# Patient Record
Sex: Female | Born: 1975
Health system: Southern US, Community
[De-identification: ages and names within clinical notes are randomized; demographics above are authoritative.]

## PROBLEM LIST (undated history)

## (undated) DIAGNOSIS — F32A Depression, unspecified: Secondary | ICD-10-CM

## (undated) DIAGNOSIS — N29 Other disorders of kidney and ureter in diseases classified elsewhere: Secondary | ICD-10-CM

## (undated) DIAGNOSIS — F329 Major depressive disorder, single episode, unspecified: Secondary | ICD-10-CM

## (undated) DIAGNOSIS — D649 Anemia, unspecified: Secondary | ICD-10-CM

## (undated) DIAGNOSIS — F419 Anxiety disorder, unspecified: Secondary | ICD-10-CM

## (undated) DIAGNOSIS — E039 Hypothyroidism, unspecified: Secondary | ICD-10-CM

## (undated) DIAGNOSIS — L309 Dermatitis, unspecified: Secondary | ICD-10-CM

## (undated) DIAGNOSIS — R5383 Other fatigue: Secondary | ICD-10-CM

## (undated) HISTORY — DX: Anxiety disorder, unspecified: F41.9

## (undated) HISTORY — DX: Anemia, unspecified: D64.9

## (undated) HISTORY — DX: Depression, unspecified: F32.A

## (undated) HISTORY — DX: Other disorders of kidney and ureter in diseases classified elsewhere: N29

## (undated) HISTORY — DX: Major depressive disorder, single episode, unspecified: F32.9

## (undated) HISTORY — DX: Other fatigue: R53.83

## (undated) HISTORY — DX: Hypothyroidism, unspecified: E03.9

## (undated) HISTORY — DX: Other disorders of calcium metabolism: E83.59

## (undated) HISTORY — DX: Dermatitis, unspecified: L30.9

---

## 2001-04-01 ENCOUNTER — Emergency Department (HOSPITAL_COMMUNITY): Admission: EM | Admit: 2001-04-01 | Discharge: 2001-04-01 | Payer: Self-pay | Admitting: Emergency Medicine

## 2001-04-16 ENCOUNTER — Encounter: Payer: Self-pay | Admitting: Family Medicine

## 2001-04-16 ENCOUNTER — Ambulatory Visit (HOSPITAL_COMMUNITY): Admission: RE | Admit: 2001-04-16 | Discharge: 2001-04-16 | Payer: Self-pay | Admitting: Family Medicine

## 2001-06-03 ENCOUNTER — Encounter: Payer: Self-pay | Admitting: Family Medicine

## 2001-06-03 ENCOUNTER — Ambulatory Visit (HOSPITAL_COMMUNITY): Admission: RE | Admit: 2001-06-03 | Discharge: 2001-06-03 | Payer: Self-pay | Admitting: Family Medicine

## 2002-04-20 ENCOUNTER — Emergency Department (HOSPITAL_COMMUNITY): Admission: EM | Admit: 2002-04-20 | Discharge: 2002-04-20 | Payer: Self-pay | Admitting: Emergency Medicine

## 2003-03-16 ENCOUNTER — Ambulatory Visit (HOSPITAL_COMMUNITY): Admission: RE | Admit: 2003-03-16 | Discharge: 2003-03-16 | Payer: Self-pay | Admitting: Family Medicine

## 2003-03-16 ENCOUNTER — Encounter: Payer: Self-pay | Admitting: Family Medicine

## 2004-11-21 ENCOUNTER — Other Ambulatory Visit: Admission: RE | Admit: 2004-11-21 | Discharge: 2004-11-21 | Payer: Self-pay | Admitting: Obstetrics and Gynecology

## 2004-11-22 ENCOUNTER — Other Ambulatory Visit: Admission: RE | Admit: 2004-11-22 | Discharge: 2004-11-22 | Payer: Self-pay | Admitting: Obstetrics and Gynecology

## 2008-10-20 ENCOUNTER — Emergency Department: Payer: Self-pay | Admitting: Emergency Medicine

## 2008-12-27 ENCOUNTER — Encounter: Payer: Self-pay | Admitting: Family Medicine

## 2009-01-03 ENCOUNTER — Encounter: Payer: Self-pay | Admitting: Family Medicine

## 2009-02-01 ENCOUNTER — Encounter: Payer: Self-pay | Admitting: Family Medicine

## 2009-06-23 ENCOUNTER — Encounter: Payer: Self-pay | Admitting: Family Medicine

## 2009-11-22 ENCOUNTER — Emergency Department (HOSPITAL_COMMUNITY): Admission: EM | Admit: 2009-11-22 | Discharge: 2009-11-22 | Payer: Self-pay | Admitting: Emergency Medicine

## 2009-12-06 HISTORY — PX: CHOLECYSTECTOMY: SHX55

## 2009-12-21 ENCOUNTER — Encounter (INDEPENDENT_AMBULATORY_CARE_PROVIDER_SITE_OTHER): Payer: Self-pay | Admitting: Surgery

## 2009-12-21 ENCOUNTER — Ambulatory Visit (HOSPITAL_COMMUNITY): Admission: EM | Admit: 2009-12-21 | Discharge: 2009-12-22 | Payer: Self-pay | Admitting: Emergency Medicine

## 2010-02-16 ENCOUNTER — Ambulatory Visit: Payer: Self-pay | Admitting: Physician Assistant

## 2010-02-16 DIAGNOSIS — K8020 Calculus of gallbladder without cholecystitis without obstruction: Secondary | ICD-10-CM | POA: Insufficient documentation

## 2010-02-16 DIAGNOSIS — F329 Major depressive disorder, single episode, unspecified: Secondary | ICD-10-CM

## 2010-02-16 DIAGNOSIS — L259 Unspecified contact dermatitis, unspecified cause: Secondary | ICD-10-CM

## 2010-02-16 DIAGNOSIS — F411 Generalized anxiety disorder: Secondary | ICD-10-CM | POA: Insufficient documentation

## 2010-02-16 DIAGNOSIS — Z9089 Acquired absence of other organs: Secondary | ICD-10-CM

## 2010-02-16 DIAGNOSIS — E079 Disorder of thyroid, unspecified: Secondary | ICD-10-CM | POA: Insufficient documentation

## 2010-02-16 DIAGNOSIS — F419 Anxiety disorder, unspecified: Secondary | ICD-10-CM | POA: Insufficient documentation

## 2010-02-16 DIAGNOSIS — E039 Hypothyroidism, unspecified: Secondary | ICD-10-CM | POA: Insufficient documentation

## 2010-02-16 DIAGNOSIS — F32A Depression, unspecified: Secondary | ICD-10-CM | POA: Insufficient documentation

## 2010-02-17 ENCOUNTER — Encounter: Payer: Self-pay | Admitting: Physician Assistant

## 2010-02-20 LAB — CONVERTED CEMR LAB
ALT: 23 units/L (ref 0–35)
Albumin: 4.7 g/dL (ref 3.5–5.2)
Alkaline Phosphatase: 63 units/L (ref 39–117)
Basophils Absolute: 0.1 10*3/uL (ref 0.0–0.1)
CO2: 23 meq/L (ref 19–32)
Calcium: 9.6 mg/dL (ref 8.4–10.5)
Creatinine, Ser: 0.95 mg/dL (ref 0.40–1.20)
Eosinophils Absolute: 0.3 10*3/uL (ref 0.0–0.7)
Glucose, Bld: 94 mg/dL (ref 70–99)
HCT: 42.8 % (ref 36.0–46.0)
Helicobacter Pylori Antibody-IgG: <0.4
Lymphs Abs: 3.3 10*3/uL (ref 0.7–4.0)
Monocytes Absolute: 1.1 10*3/uL — ABNORMAL HIGH (ref 0.1–1.0)
Monocytes Relative: 13 % — ABNORMAL HIGH (ref 3–12)
Neutro Abs: 3.7 10*3/uL (ref 1.7–7.7)
Neutrophils Relative %: 44 % (ref 43–77)
Sodium: 140 meq/L (ref 135–145)
TSH: 2.129 microintl units/mL (ref 0.350–4.500)
Total Protein: 7.2 g/dL (ref 6.0–8.3)

## 2010-02-21 ENCOUNTER — Encounter (INDEPENDENT_AMBULATORY_CARE_PROVIDER_SITE_OTHER): Payer: Self-pay | Admitting: *Deleted

## 2010-02-21 ENCOUNTER — Encounter: Payer: Self-pay | Admitting: Physician Assistant

## 2010-02-22 ENCOUNTER — Encounter: Payer: Self-pay | Admitting: Physician Assistant

## 2010-02-27 ENCOUNTER — Telehealth: Payer: Self-pay | Admitting: Physician Assistant

## 2010-03-02 ENCOUNTER — Encounter: Payer: Self-pay | Admitting: Physician Assistant

## 2010-03-08 ENCOUNTER — Ambulatory Visit (HOSPITAL_COMMUNITY): Admission: RE | Admit: 2010-03-08 | Discharge: 2010-03-08 | Payer: Self-pay | Admitting: Internal Medicine

## 2010-03-09 ENCOUNTER — Telehealth: Payer: Self-pay | Admitting: Physician Assistant

## 2010-03-09 ENCOUNTER — Emergency Department (HOSPITAL_COMMUNITY)
Admission: EM | Admit: 2010-03-09 | Discharge: 2010-03-09 | Payer: Self-pay | Source: Home / Self Care | Admitting: Family Medicine

## 2010-03-14 ENCOUNTER — Encounter: Payer: Self-pay | Admitting: Physician Assistant

## 2010-03-20 ENCOUNTER — Ambulatory Visit: Payer: Self-pay | Admitting: Physician Assistant

## 2010-03-21 ENCOUNTER — Encounter: Payer: Self-pay | Admitting: Physician Assistant

## 2010-03-22 ENCOUNTER — Encounter: Payer: Self-pay | Admitting: Physician Assistant

## 2010-03-27 ENCOUNTER — Encounter: Payer: Self-pay | Admitting: Physician Assistant

## 2010-03-28 ENCOUNTER — Ambulatory Visit: Payer: Self-pay | Admitting: Physician Assistant

## 2010-03-28 ENCOUNTER — Telehealth: Payer: Self-pay | Admitting: Physician Assistant

## 2010-03-28 LAB — CONVERTED CEMR LAB
BUN: 19 mg/dL (ref 6–23)
CO2: 21 meq/L (ref 19–32)
CO2: 23 meq/L (ref 19–32)
Calcium: 8.9 mg/dL (ref 8.4–10.5)
Creatinine 24 HR UR: 1819 mg/24hr — ABNORMAL HIGH (ref 700–1800)
Creatinine Clearance: 106 mL/min (ref 75–115)
Creatinine, Urine: 148.6 mg/dL
Glucose, Bld: 132 mg/dL — ABNORMAL HIGH (ref 70–99)
Lipase: 29 units/L (ref 0–75)
Phosphorus, Ur: 62.5 mg/dL
Phosphorus, Ur: 91 mg/dL
Phosphorus: 2.6 mg/dL (ref 2.3–4.6)
Phosphorus: 2.6 mg/dL (ref 2.3–4.6)
Potassium: 4.1 meq/L (ref 3.5–5.3)
Potassium: 4.1 meq/L (ref 3.5–5.3)
Sodium: 141 meq/L (ref 135–145)

## 2010-03-30 ENCOUNTER — Encounter: Payer: Self-pay | Admitting: Physician Assistant

## 2010-03-30 ENCOUNTER — Ambulatory Visit (HOSPITAL_COMMUNITY): Admission: RE | Admit: 2010-03-30 | Discharge: 2010-03-30 | Payer: Self-pay | Admitting: Internal Medicine

## 2010-03-30 ENCOUNTER — Encounter (INDEPENDENT_AMBULATORY_CARE_PROVIDER_SITE_OTHER): Payer: Self-pay | Admitting: *Deleted

## 2010-04-14 ENCOUNTER — Telehealth: Payer: Self-pay | Admitting: Physician Assistant

## 2010-04-20 ENCOUNTER — Telehealth: Payer: Self-pay | Admitting: Physician Assistant

## 2010-04-20 ENCOUNTER — Ambulatory Visit: Payer: Self-pay | Admitting: Physician Assistant

## 2010-04-20 DIAGNOSIS — M65839 Other synovitis and tenosynovitis, unspecified forearm: Secondary | ICD-10-CM

## 2010-04-20 DIAGNOSIS — B009 Herpesviral infection, unspecified: Secondary | ICD-10-CM | POA: Insufficient documentation

## 2010-04-20 DIAGNOSIS — M65849 Other synovitis and tenosynovitis, unspecified hand: Secondary | ICD-10-CM

## 2010-04-21 ENCOUNTER — Other Ambulatory Visit: Admission: RE | Admit: 2010-04-21 | Discharge: 2010-04-21 | Payer: Self-pay | Admitting: Internal Medicine

## 2010-04-24 ENCOUNTER — Telehealth: Payer: Self-pay | Admitting: Physician Assistant

## 2010-04-26 ENCOUNTER — Encounter: Payer: Self-pay | Admitting: Physician Assistant

## 2010-05-05 ENCOUNTER — Ambulatory Visit: Payer: Self-pay | Admitting: Gastroenterology

## 2010-05-05 ENCOUNTER — Encounter (INDEPENDENT_AMBULATORY_CARE_PROVIDER_SITE_OTHER): Payer: Self-pay | Admitting: *Deleted

## 2010-05-17 ENCOUNTER — Encounter: Payer: Self-pay | Admitting: Physician Assistant

## 2010-05-18 ENCOUNTER — Emergency Department (HOSPITAL_BASED_OUTPATIENT_CLINIC_OR_DEPARTMENT_OTHER): Admission: EM | Admit: 2010-05-18 | Discharge: 2010-05-18 | Payer: Self-pay | Admitting: Emergency Medicine

## 2010-05-25 ENCOUNTER — Ambulatory Visit (HOSPITAL_COMMUNITY): Admission: RE | Admit: 2010-05-25 | Discharge: 2010-05-25 | Payer: Self-pay | Admitting: Gastroenterology

## 2010-05-25 ENCOUNTER — Ambulatory Visit: Payer: Self-pay | Admitting: Gastroenterology

## 2010-05-25 HISTORY — PX: ESOPHAGOGASTRODUODENOSCOPY: SHX1529

## 2010-06-21 ENCOUNTER — Ambulatory Visit: Payer: Self-pay | Admitting: Obstetrics and Gynecology

## 2010-06-22 ENCOUNTER — Telehealth: Payer: Self-pay | Admitting: Gastroenterology

## 2010-06-27 ENCOUNTER — Telehealth: Payer: Self-pay | Admitting: Gastroenterology

## 2010-07-01 ENCOUNTER — Encounter: Payer: Self-pay | Admitting: Physician Assistant

## 2010-07-06 ENCOUNTER — Ambulatory Visit: Payer: Self-pay | Admitting: Physician Assistant

## 2010-07-06 DIAGNOSIS — R5381 Other malaise: Secondary | ICD-10-CM

## 2010-07-06 DIAGNOSIS — R5383 Other fatigue: Secondary | ICD-10-CM

## 2010-07-07 ENCOUNTER — Telehealth: Payer: Self-pay | Admitting: Physician Assistant

## 2010-07-07 ENCOUNTER — Encounter: Payer: Self-pay | Admitting: Physician Assistant

## 2010-07-07 LAB — CONVERTED CEMR LAB
Albumin: 4.6 g/dL (ref 3.5–5.2)
Creatinine, Ser: 0.87 mg/dL (ref 0.40–1.20)
Eosinophils Relative: 2 % (ref 0–5)
Free T4: 1.39 ng/dL (ref 0.80–1.80)
HCT: 41.7 % (ref 36.0–46.0)
Hemoglobin: 13.8 g/dL (ref 12.0–15.0)
Lymphs Abs: 2.8 10*3/uL (ref 0.7–4.0)
MCHC: 33.1 g/dL (ref 30.0–36.0)
Monocytes Absolute: 0.8 10*3/uL (ref 0.1–1.0)
Neutro Abs: 6.9 10*3/uL (ref 1.7–7.7)
Neutrophils Relative %: 64 % (ref 43–77)
Potassium: 4.5 meq/L (ref 3.5–5.3)
RDW: 14 % (ref 11.5–15.5)
T3, Free: 2.7 pg/mL (ref 2.3–4.2)
TSH: 2.91 microintl units/mL (ref 0.350–4.500)
Total Bilirubin: 0.3 mg/dL (ref 0.3–1.2)
Total Protein: 7.4 g/dL (ref 6.0–8.3)

## 2010-07-11 ENCOUNTER — Encounter (INDEPENDENT_AMBULATORY_CARE_PROVIDER_SITE_OTHER): Payer: Self-pay | Admitting: *Deleted

## 2010-07-11 LAB — CONVERTED CEMR LAB
Barbiturate Quant, Ur: NEGATIVE
Benzodiazepines.: NEGATIVE
Casts: NONE SEEN /lpf
Leukocytes, UA: NEGATIVE
Marijuana Metabolite: NEGATIVE
Methadone: NEGATIVE
Opiate Screen, Urine: NEGATIVE
Phencyclidine (PCP): NEGATIVE
WBC, UA: NONE SEEN cells/hpf (ref <3)

## 2010-07-13 ENCOUNTER — Telehealth: Payer: Self-pay | Admitting: Physician Assistant

## 2010-07-20 ENCOUNTER — Encounter: Payer: Self-pay | Admitting: Physician Assistant

## 2010-08-24 ENCOUNTER — Telehealth (INDEPENDENT_AMBULATORY_CARE_PROVIDER_SITE_OTHER): Payer: Self-pay | Admitting: Nurse Practitioner

## 2010-11-01 ENCOUNTER — Inpatient Hospital Stay (HOSPITAL_COMMUNITY)
Admission: AD | Admit: 2010-11-01 | Discharge: 2010-11-01 | Payer: Self-pay | Source: Home / Self Care | Attending: Obstetrics and Gynecology | Admitting: Obstetrics and Gynecology

## 2010-12-03 LAB — CONVERTED CEMR LAB
Blood in Urine, dipstick: NEGATIVE
Calcium, Total (PTH): 8.8 mg/dL (ref 8.4–10.5)
Ketones, urine, test strip: NEGATIVE
Nitrite: NEGATIVE
Protein, U semiquant: NEGATIVE
Specific Gravity, Urine: 1.02
Urobilinogen, UA: 0.2
WBC Urine, dipstick: NEGATIVE

## 2010-12-05 NOTE — Letter (Signed)
Summary: *HSN Results Follow up  [facility]  [address] [place], [place] [address]   Phone: [phone]  Fax: [phone]      02/21/2010   Zoe Aslinger [address] [place], [place]  [address]   Dear  Ms. Lalanya Hillier,                            ____S.Drinkard,FNP   ____D. Gore,FNP       ____B. McPherson,MD   ____V. Rankins,MD    ____E. Mulberry,MD    ____N. Daphine Deutscher, FNP  ____D. Reche Dixon, MD    ____K. Philipp Deputy, MD    ____Other     This letter is to inform you that your recent test(s):  _______Pap Smear    ___X____Lab Test     _______X-ray    ___X____ is within acceptable limits  _______ requires a medication change  _______ requires a follow-up lab visit  _______ requires a follow-up visit with your provider   Comments:       _________________________________________________________ If you have any questions, please contact our office                     Sincerely,  [place] Shannon [facility]

## 2010-12-05 NOTE — Letter (Signed)
Summary: MED/SOLUTIONS//APPROVED  MED/SOLUTIONS//APPROVED   Imported By: Arta Bruce 05/17/2010 12:43:01  _____________________________________________________________________  External Attachment:    Type:   Image     Comment:   External Document

## 2010-12-05 NOTE — Assessment & Plan Note (Signed)
Summary: OFFICE VISIT//CNS   Vital Signs:  Patient profile:   35 year old female Height:      63 inches Weight:      147 pounds BMI:     26.13 Temp:     98.1 degrees F oral Pulse rate:   92 / minute Pulse rhythm:   regular BP sitting:   116 / 81  (left arm) Cuff size:   regular  Vitals Entered By: [place] Shannon (April 20, 2010 2:08 PM)  Primary Care Provider:  Tereso Newcomer, PA-C   History of Present Illness: Here for CPP. Health Maint: Last Pap 10/2008 No abnormal paps. No abnormal bleeding.  No vaginal d/c or itching or burning. Never had mammo. Not taking calcium.  PHQ9=3 today. Patient restarted OCP she had at home.  Wants IUD.  Abdominal Pain:  No change.  Does not have appt with GI until next month.    Nephrocalcinosis.  Calcium level high on 1/2 24 hour urines.  Spoke with Dr. Hyman Hopes of nephrology today.  PTH and CXR to be checked and patient to be referred to nephrology.  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  Uses alcohol to dry them up.  Wrist pain:  Left wrist.  Works in [employer].  Notes dorsal aspect on medial side.   Worse with movement.  Better with NSAIDs.   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 reports h/o Graves Disease; took meds and then became hypothyroid Anxiety   a. on alprazolam for many years (Dr. Audria Nine  started) ? Anemia Depression   a.  had difficulty with antidepressants in the past (Effexor, Lexapro) with SE's; states does not want to take again Eczema  Past Surgical History: Last updated: 02/16/2010 Cholecystectomy (12/2009) Caesarean section  Family History: Reviewed history from 02/16/2010 and no changes required. Family History Diabetes 1st degree relative - Dad Skin CA - Grandfather Leukemia - Grandmother No colon, breast, ovarian cancer. Heart Disease - grandparents  Social History: Reviewed history from 02/16/2010 and no changes required. 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    Physical Exam  General:  alert, well-developed, and well-nourished.   Head:  normocephalic and atraumatic.   Eyes:  pupils equal, pupils round, pupils reactive to light, and no optic disk abnormalities.   Ears:  R ear normal and L ear normal.   Nose:  no external deformity.   Mouth:  pharynx pink and moist, no erythema, and no exudates.   Neck:  supple.   Breasts:  skin/areolae normal, no masses, no abnormal thickening, no nipple discharge, no tenderness, and no adenopathy.   Lungs:  normal breath sounds.   Heart:  normal rate and regular rhythm.   Abdomen:  soft,  non-tender, and normal bowel sounds.   Rectal:  no external abnormalities.   Genitalia:  normal introitus, no external lesions, no vaginal discharge, mucosa pink and moist, no vaginal or cervical lesions, no vaginal atrophy, no friaility or hemorrhage, normal uterus size and position, and no adnexal masses or tenderness.   Msk:  left wrist: no deformity no snuff box tenderness no pain with palp Extremities:  no edema Neurologic:  alert & oriented X3 and cranial nerves II-XII intact.   Psych:  normally  interactive.     Impression & Recommendations:  Problem # 1:  HERPES LABIALIS (ICD-054.9) will give Rx for Valtrex to use as needed  Problem # 2:  TENDINITIS, LEFT WRIST (ICD-727.05) RICE f/u as needed  Problem # 3:  NEPHROCALCINOSIS (ICD-275.49) refer to Nephrology  Orders: T-Urinalysis ([address]) T-Parathyroid Hormone, Intact ([address]11-91478) CXR- 2view (CXR) Nephrology Referral (Nephro)  Problem # 4:  EPIGASTRIC PAIN (ICD-789.06) GI appt pending  Problem # 5:  Preventive Health Care (ICD-V70.0)  Orders: T-HIV Antibody  (Reflex) ([address]-13086) T-Syphilis Test (RPR) [phone]) T-Pap Smear, Thin Prep (284[address]) T- GC Chlamydia ([address]) KOH/ WET Mount [phone]) T-Urinalysis ([address]-03474)  Problem # 6:  CONTRACEPTIVE MANAGEMENT (ICD-V25.09)  wants the IUD refer to GYN  Orders: Gynecologic Referral (Gyn)  Complete Medication List: 1)  Alprazolam 0.5 Mg Tabs (Alprazolam) .Marland Kitchen.. 1 by mouth every day as needed for anxiety 2)  Synthroid 75 Mcg Tabs (Levothyroxine sodium) .Marland Kitchen.. 1 by mouth every morning on a empty stomach 3)  Clobetasol Propionate 0.05 % Crea (Clobetasol propionate) .... Apply two times a day to eczema as needed 4)  Pepcid 20 Mg Tabs (Famotidine) .... Take 1 tablet by mouth two times a day 5)  Valtrex 500 Mg Tabs (Valacyclovir hcl) .... 4 tabs by mouth two times a day for one day at the sign of outbreak of cold sore  Patient Instructions: 1)  Take 400-600 mg of Ibuprofen (Advil, Motrin) with food every 4-6 hours as needed  for relief of pain or comfort of fever.  2)  Ice your wrist two times a day for a week. 3)  Try to use your hand less for 7-10 days so that it will heal. 4)  Use the Valtrex when you start to feel like a cold sore is to break out. 5)  Schedule fasting lipids (V70.0). 6)  Return to lab in 6 months for TSH (244.9). 7)  Someone will call you about an appointment with the kidney doctor. 8)  Call if you do not get into the  gastroenterologist. 9)  Tobacco is very bad for your health and your loved ones ! You should stop smoking !  10)  Stop smoking tips: Choose a quit date. Cut down before the quit date. Decide what you will do as a substitute when you feel the urge to smoke(gum, toothpick, exercise).  11)  Please schedule a follow-up appointment in 1 year with Amnah Breuer for CPP or sooner if needed.  Prescriptions: VALTREX 500 MG TABS (VALACYCLOVIR HCL) 4 tabs by mouth two times a day for one day at the sign of outbreak of cold sore  #30 x 1   Entered and Authorized by:   Tereso Newcomer PA-C   Signed by:   Tereso Newcomer PA-C on 04/20/2010   Method used:   Print then Give to Patient   RxID:   [id number]   Laboratory Results   Urine Tests    Routine Urinalysis   Glucose: negative   (Normal Range: Negative) Bilirubin:  negative   (Normal Range: Negative) Ketone: negative   (Normal Range: Negative) Spec. Gravity: 1.020   (Normal Range: 1.003-1.035) Blood: negative   (Normal Range: Negative) pH: 6.0   (Normal Range: 5.0-8.0) Protein: negative   (Normal Range: Negative) Urobilinogen: 0.2   (Normal Range: 0-1) Nitrite: negative   (Normal Range: Negative) Leukocyte Esterace: negative   (Normal Range: Negative)      Wet Mount Source: vaginal WBC/hpf: 1-5 Bacteria/hpf: rare Clue cells/hpf: none  Negative whiff Yeast/hpf: none Wet Mount KOH: Negative Trichomonas/hpf: none

## 2010-12-05 NOTE — Letter (Signed)
Summary: REQUESTING RECORDS FROM [facility]  REQUESTING RECORDS FROM [facility]   Imported By: Arta Bruce 03/27/2010 12:03:28  _____________________________________________________________________  External Attachment:    Type:   Image     Comment:   External Document

## 2010-12-05 NOTE — Progress Notes (Signed)
Summary: Home health referral  Phone Note Call from Patient   Summary of Call: [facility] called to say that did not get urine Initial call taken by: Michelle Nasuti,  July 07, 2010 11:40 AM  Follow-up for Phone Call        left message for pt to return call Follow-up by: Michelle Nasuti,  July 07, 2010 11:42 AM  Additional Follow-up for Phone Call Additional follow up Details #1::        repeat Additional Follow-up by: Tereso Newcomer PA-C,  July 07, 2010 11:50 AM    Additional Follow-up for Phone Call Additional follow up Details #2::    PT IS AWARE WILL COMEIN AT 4... [place] SHANNON  All labs were normal. I want to set her up for an overnight oximetry test to screen for sleep apnea. Referral is in basket. Please send to Ar[facility] Medici after patient has been notified. Tereso Newcomer PA-C  July 07, 2010 12:47 PM   Additional Follow-up for Phone Call Additional follow up Details #3:: Details for Additional Follow-up Action Taken: pt is aware via brenda.... [place] Shannon  July 11, 2010 3:20 PM      Complete Medication List: 1)  Alprazolam 0.5 Mg Tabs (Alprazolam) .Marland Kitchen.. 1 by mouth every day as needed for anxiety 2)  Synthroid 75 Mcg Tabs (Levothyroxine sodium) .Marland Kitchen.. 1 by mouth every morning on a empty stomach 3)  Clobetasol Propionate 0.05 % Crea (Clobetasol propionate) .... Apply two times a day to eczema as needed 4)  Valtrex 500 Mg Tabs (Valacyclovir hcl) .... 4 tabs by mouth two times a day for one day at the sign of outbreak of cold sore 5)  Ibuprofen 200 Mg Tabs (Ibuprofen) .... Take one by mouth once daily as needed 6)  Cholestyramine 4 Gm/dose Powd (Cholestyramine) .... 4 gm by mouth q am  do not take within 2 hours of other meds  Other Orders: Home Health Referral [facility])

## 2010-12-05 NOTE — Procedures (Signed)
Summary: Endoscopic Ultrasound  Patient: Katie Little Note: All result statuses are Final unless otherwise noted.  Tests: (1) Endoscopic Ultrasound (EUS)  EUS Endoscopic Ultrasound                             DONE     [facility]     [address] [place], [place]  [address]           ENDOSCOPIC ULTRASOUND PROCEDURE REPORT     PATIENT:  Katie, Little  MR#:  [id number]     BIRTHDATE:  December 10, 1975  GENDER:  female     ENDOSCOPIST:  Rachael Fee, MD     REFERRING:  Tereso Newcomer, MD     PROCEDURE DATE:  05/25/2010     PROCEDURE:  Upper EUS     ASA CLASS:  Class II     INDICATIONS:  intermittent abd pains, similar to pains that     occured before GB removed earlier this year (normal IOC), normal     LFTs, CBC, cmet.     MEDICATIONS:   Fentanyl 100 mcg IV, Versed 8.5 mg IV     DESCRIPTION OF PROCEDURE:   After the risks benefits and     alternatives of the procedure were  explained, informed consent     was obtained. The patient was then placed in the left, lateral,     decubitus postion and IV sedation was administered. Throughout the     procedure, the patient's blood pressure, pulse and oxygen     saturations were monitored continuously.  Under direct     visualization, the  endoscope was introduced through the mouth and     advanced to the duodenum.  Water was used as necessary to provide     an acoustic interface.  Upon completion of the imaging, water was     removed and the patient was sent to the recovery room in     satisfactory condition.     <<PROCEDUREIMAGES>>           Endoscopic findings:     1. Normal esophagus     2. Mild gastritis, biopsied to check for H. pylori     3. Normal duodenum, biopsied to check for celiac sprue changes           EUS findings:     1. Normal CBD; non-dilated and without filling defects.     2. Gallbladder surgically absent.     3. Normal pancreatic parenchyma in head, neck, body and tail.     4. Normal main pancreatic  duct.     5. Limited views of liver, spleen, portal and splenic veins were     all normal.           Impression:     There are no retained biliary stones.  Mild gastritis was biopsied     to check for H. pylori and duodenum biopsied to check for celiac     sprue.  Await final biopsies, in the meantime since PPI did not     help her symptoms will try carafate twice daily.           ______________________________     Rachael Fee, MD           n.     eSIGNED:   Rachael Fee at 05/25/2010 08:58 AM  Mckayla, [place], [id number]  Note: An exclamation mark (!) indicates a result that was not dispersed into the flowsheet. Document Creation Date: 05/25/2010 8:59 AM _______________________________________________________________________  (1) Order result status: Final Collection or observation date-time: 05/25/2010 08:49 Requested date-time:  Receipt date-time:  Reported date-time:  Referring Physician:   Ordering Physician: Rob Bunting [phone]) Specimen Source:  Source: Launa Grill Order Number: [id number] Lab site:

## 2010-12-05 NOTE — Letter (Signed)
Summary: PRIOR PCP RECORDS  PRIOR PCP RECORDS   Imported By: Arta Bruce 03/28/2010 10:33:09  _____________________________________________________________________  External Attachment:    Type:   Image     Comment:   External Document

## 2010-12-05 NOTE — Letter (Signed)
Summary: MED SOLUTIONS  MED SOLUTIONS   Imported By: Arta Bruce 03/06/2010 10:34:16  _____________________________________________________________________  External Attachment:    Type:   Image     Comment:   External Document

## 2010-12-05 NOTE — Assessment & Plan Note (Signed)
Summary: without energy//gk   Vital Signs:  Patient profile:   35 year old female Height:      63 inches Weight:      144 pounds BMI:     25.60 Temp:     99.2 degrees F oral Pulse rate:   88 / minute Pulse rhythm:   regular Resp:     18 per minute BP sitting:   120 / 82  (left arm) Cuff size:   regular  Vitals Entered By: [place] Shannon (July 06, 2010 11:55 AM) CC: pt is here cause she doesn't have any energy...Marland KitchenMarland Kitchen pt says she has been feeling like this for a while and other providers told her it was her thyroid....  Is Patient Diabetic? No Pain Assessment Patient in pain? no       Does patient need assistance? Functional Status Self care Ambulation Normal   Primary Care Provider:  Tereso Newcomer, PA-C  CC:  pt is here cause she doesn't have any energy...Marland KitchenMarland Kitchen pt says she has been feeling like this for a while and other providers told her it was her thyroid.... .  History of Present Illness: 35 year old female presents with complaints of fatigue.  She has had problems with fatigue for years now.  It has always been contributed to her thyroid.  She falls asleep during the day.  She wakes up feeling sleepy.  She denies any a.m. headaches.  She does not have any trouble with blood pressure.  She denies any witnessed apneic episodes or severe snoring.  She denies fevers, chills, shortness of breath, chest pain, diarrhea, constipation, melena, hematochezia, significant joint pain, skin changes or rashes, hair changes, syncope.  Current Medications (verified): 1)  Alprazolam 0.5 Mg Tabs (Alprazolam) .Marland Kitchen.. 1 By Mouth Every Day As Needed For Anxiety 2)  Synthroid 75 Mcg Tabs (Levothyroxine Sodium) .Marland Kitchen.. 1 By Mouth Every Morning On A Empty Stomach 3)  Clobetasol Propionate 0.05 % Crea (Clobetasol Propionate) .... Apply Two Times A Day To Eczema As Needed 4)  Valtrex 500 Mg Tabs (Valacyclovir Hcl) .... 4 Tabs By Mouth Two Times A Day For One Day At The Sign of Outbreak of Cold Sore 5)   Ibuprofen 200 Mg Tabs (Ibuprofen) .... Take One By Mouth Once Daily As Needed 6)  Cholestyramine 4 Gm/dose Powd (Cholestyramine) .... 4 Gm By Mouth Q Am  Do Not Take Within 2 Hours of Other Meds  Allergies (verified): No Known Drug Allergies  Physical Exam  General:  alert, well-developed, and well-nourished.   Head:  normocephalic and atraumatic.   Neck:  supple.   Lungs:  normal breath sounds.   Heart:  normal rate and regular rhythm.   Abdomen:  soft.   Msk:  normal ROM.   Extremities:  no edema Neurologic:  alert & oriented X3 and cranial nerves II-XII intact.   Skin:  turgor normal and no rashes.   Psych:  normally interactive.    a  Impression & Recommendations:  Problem # 1:  FATIGUE (ICD-780.79) ? etiology has daytime hypersomnolence but no body habitus c/w sleep apnea check labs if unremarkable; get overnight oximetry with [facility] ($33) ph # [phone] increase activity f/u in 2 mos  Orders: T-Comprehensive Metabolic Panel [phone]) T-CBC w/Diff [phone]) T-TSH [phone]) T-Sed Rate (Automated) [phone]) T-Drug Screen-Urine, (single) [phone]) T-Urinalysis ([id number]) T-Cortisol Total [phone]) T-T4, Free ([id number]) T-T3, Free [phone])  Complete Medication List: 1)  Alprazolam 0.5 Mg Tabs (Alprazolam) .Marland Kitchen.. 1 by mouth every day as needed for anxiety  2)  Synthroid 75 Mcg Tabs (Levothyroxine sodium) .Marland Kitchen.. 1 by mouth every morning on a empty stomach 3)  Clobetasol Propionate 0.05 % Crea (Clobetasol propionate) .... Apply two times a day to eczema as needed 4)  Valtrex 500 Mg Tabs (Valacyclovir hcl) .... 4 tabs by mouth two times a day for one day at the sign of outbreak of cold sore 5)  Ibuprofen 200 Mg Tabs (Ibuprofen) .... Take one by mouth once daily as needed 6)  Cholestyramine 4 Gm/dose Powd (Cholestyramine) .... 4 gm by mouth q am  do not take within 2 hours of other meds  Patient Instructions: 1)  Slowly  increase activity. 2)  Walk for 5 minutes every day.  Increase by 1 minute a week until you get to 20 minutes a day. 3)  Schedule follow up with Scott in 2 months.

## 2010-12-05 NOTE — Progress Notes (Signed)
Summary: WANTS LAB RESULTS  Phone Note Call from Patient Call back at Home Phone [phone]   Reason for Call: Lab or Test Results Summary of Call: Katie Little PT. MS Jenifer CALLED, BECAUSE SHE WOULD LIKE TO SPEAK W/SOMEONE ABOUT HER LABS THAT WAS  DONE RECENTLY, AND SHE SAYS THAT HER APPOINTMENT NEXT WEEK ON 06/16 IS SUPOSE TO BE A CPP AND IT IS NOT, IT SAYS ITS AN APPOINTMENT FOR A FU ON STOMACH PAIN, AND SHE IS A LITTLE UPSET, BECAUSE HER INS (MEDICAID) RUNS OUT SOON. Initial call taken by: Leodis Rains,  April 14, 2010 3:14 PM  Follow-up for Phone Call        Left message on answering machine for pt to call back...Marland KitchenMarland KitchenArmenia Shannon  April 17, 2010 12:18 PM   Additional Follow-up for Phone Call Additional follow up Details #1::        pt is aware and rescheduled for cpp Additional Follow-up by: [place] Shannon,  April 17, 2010 5:00 PM

## 2010-12-05 NOTE — Letter (Signed)
Summary: Historic Patient File  Historic Patient File   Imported By: Arta Bruce 04/19/2010 14:33:22  _____________________________________________________________________  External Attachment:    Type:   Image     Comment:   External Document

## 2010-12-05 NOTE — Letter (Signed)
Summary: *HSN Results Follow up  [facility]  [address] [place], [place] [address]   Phone: [phone]  Fax: [phone]      04/26/2010   Katie Little [address] [place], [place]  [address]   Dear  Ms. [name],                            ____S.Drinkard,FNP   ____D. Gore,FNP       ____B. McPherson,MD   ____V. Rankins,MD    ____E. Mulberry,MD    ____N. Daphine Deutscher, FNP  ____D. Reche Dixon, MD    ____K. Philipp Deputy, MD    __x__S. Alben Spittle, PA-C     This letter is to inform you that your recent test(s):  ___x____Pap Smear    _______Lab Test     _______X-ray    ___x____ is within acceptable limits  _______ requires a medication change  _______ requires a follow-up lab visit  _______ requires a follow-up visit with your provider   Comments:       _________________________________________________________ If you have any questions, please contact our office                     Sincerely,  Tereso Newcomer PA-C [facility]

## 2010-12-05 NOTE — Procedures (Signed)
Summary: Prep/[facility]  Prep/[facility]   Imported By: Lester [place] 05/11/2010 11:10:57  _____________________________________________________________________  External Attachment:    Type:   Image     Comment:   External Document

## 2010-12-05 NOTE — Assessment & Plan Note (Signed)
History of Present Illness Visit Type: Initial Consult Primary GI MD: Rob Bunting MD Primary Provider: Tereso Newcomer, PA-C Requesting Provider: Tereso Newcomer, PA-C Chief Complaint: Epigastric pain  History of Present Illness:     very pleasant 35 year old woman who has been having epigastric pains, about 2 times a week, 74min-3 hours.  She can have nausea, no vomiting.  Nothing reliably brings the pain on.  Nothing improves it.  Not positional at all.  she says the pain is similar to her previous biliary colic however not as intense and it does not last as long.  GB removed 12/2009, was "full of stones" and was having signficant pains, eventually presented with acute cholecystitis. Dr. Ovidio Kin remove her gallbladder laparoscopically. He documented a normal intraoperative cholangiogram.  She takes ibuprofen 1-2 times a month at most.  No other NSAIDs.  She has rare pyrosis, was getting more before she intentionally lost 40 pounds.  she had complete metabolic profile, CBC about 2 months ago for this pain and it these tests were all normal. She has tried H2 blocker and this caused worse pains. She has not tried proton pump inhibitor           Current Medications (verified): 1)  Alprazolam 0.5 Mg Tabs (Alprazolam) .Marland Kitchen.. 1 By Mouth Every Day As Needed For Anxiety 2)  Synthroid 75 Mcg Tabs (Levothyroxine Sodium) .Marland Kitchen.. 1 By Mouth Every Morning On A Empty Stomach 3)  Clobetasol Propionate 0.05 % Crea (Clobetasol Propionate) .... Apply Two Times A Day To Eczema As Needed 4)  Valtrex 500 Mg Tabs (Valacyclovir Hcl) .... 4 Tabs By Mouth Two Times A Day For One Day At The Sign of Outbreak of Cold Sore 5)  Ibuprofen 200 Mg Tabs (Ibuprofen) .... Take One By Mouth Once Daily As Needed  Allergies (verified): No Known Drug Allergies  Past History:  Past Medical History: Cholelithiasis s/p 2011 laparoscopic cholecystectomy with normal Intra-Op cholangiogram Hypothyroidism   a. reports h/o  Graves Disease; took meds and then became hypothyroid Anxiety   a. on alprazolam for many years (Dr. Audria Nine started) ? Anemia Depression   a.  had difficulty with antidepressants in the past (Effexor, Lexapro) with SE's; states does not want to take again Eczema  Past Surgical History: Cholecystectomy (12/2009)  Caesarean section  Family History: Family History Diabetes 1st degree relative - Dad Skin CA - Grandfather Leukemia - Grandmother No colon, breast, ovarian cancer. 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  All other review of systems was otherwise negative.   Vital Signs:  Patient profile:   35 year old female Height:      63 inches Weight:      142.0 pounds BMI:     25.25 Pulse rate:   80 / minute Pulse rhythm:   regular BP sitting:   104 / 80  (left arm) Cuff size:   regular  Vitals Entered By: Harlow Mares CMA (AAMA) (May 05, 2010 10:00 AM)  Physical Exam  Additional Exam:  Constitutional: generally well appearing Psychiatric: alert and oriented times 3 Eyes: extraocular movements intact Mouth: oropharynx moist, no lesions Neck: supple, no lymphadenopathy Cardiovascular: heart regular rate and rythm  Lungs: CTA bilaterally Abdomen: soft, non-tender, non-distended, no obvious ascites, no peritoneal signs, normal bowel sounds Extremities: no lower extremity edema bilaterally Skin: no lesions on visible extremities    Impression & Recommendations:  Problem # 1:  Epigastric pain she had a normal Intra-Op cholangiogram and has had normal liver tests since then. These facts argue against retained common duct stone. We will proceed with EGD at her soonest  convenience. She may have peptic ulcer disease, gastritis, perhaps significant GERD. In the meantime she will take proton pump inhibitor one pill once daily.  Patient Instructions: 1)  You will be scheduled to have an upper endoscopy (about 2 weeks from now). 2)  Samples of PPI (nexium) given, take one pill 20-30 min prior to dinner meal. 3)  A copy of this information will be sent to Dr. Alben Spittle. 4)  The medication list was reviewed and reconciled.  All changed / newly prescribed medications were explained.  A complete medication list was provided to the patient / caregiver.  Appended Document: Orders Update/EGD    Clinical Lists Changes  Orders: Added new Test order of ZEGD (ZEGD) - Signed

## 2010-12-05 NOTE — Progress Notes (Signed)
Summary: Birth Control options  Phone Note Call from Patient   Summary of Call: THE PT WANTS TO TALK TO Katie ABOUT BIRTH CONTROL OPTIONS. Adilen Pavelko PA-C Initial call taken by: Manon Hilding,  April 24, 2010 2:16 PM  Follow-up for Phone Call        Left message on answer machine for pt. to return call Gaylyn Cheers RN  April 25, 2010 11:59 AM  Pt. returned call [facility] she waited over 1 1/2 hrs and was told no one was there to do her exam. They suggested she plan for 5 hrs when she reschedules however her work situation will not allow her to take time off. Medicaid runs out in July.  GYN Clinic cost:the Mirena cost app. $900 if paying out of pocket, with orange card it would depend how much they are covered. Medicaid would cover. An application process is required for the Mirena. Will forward on to S. Alben Spittle PA Follow-up by: Gaylyn Cheers RN,  April 25, 2010 12:38 PM  Additional Follow-up for Phone Call Additional follow up Details #1::        I submitted a referral to [name] already for GYN for IUD.  I explained to [name] that her medicaid runs out in July.  I would imagine she will hear something soon from [facility].  OCPs is not a good choice in someone who smokes due to increased risk of blood clots.  She can get Depo here as an alternative option. Additional Follow-up by: Tereso Newcomer PA-C,  April 25, 2010 1:25 PM    Additional Follow-up for Phone Call Additional follow up Details #2::    Left message on answering machine for pt to call back.[name]  April 26, 2010 4:57 PM   pt is aware of options and would like to get iud from gyn.... Katie Little  April 27, 2010 8:38 AM

## 2010-12-05 NOTE — Miscellaneous (Signed)
  Clinical Lists Changes  Observations: Added new observation of PAST MED HX: Cholelithiasis s/p 2011 laparoscopic cholecystectomy with normal Intra-Op cholangiogram Hypothyroidism   a. reports h/o Graves Disease; took meds and then became hypothyroid Anxiety   a. on alprazolam for many years (Dr. Audria Nine started) ? Anemia Depression   a.  had difficulty with antidepressants in the past (Effexor, Lexapro) with SE's; states does not want to take again Eczema EGD and Endo U/S done 7.21.2011   a. normal esoph; mild gastritis; normal CBD; GB absent; normal; no biliary stones; bx to check for H. pylori and celiac sprue (07/01/2010 15:27)        Past History:  Past Medical History: Cholelithiasis s/p 2011 laparoscopic cholecystectomy with normal Intra-Op cholangiogram Hypothyroidism   a. reports h/o Graves Disease; took meds and then became hypothyroid Anxiety   a. on alprazolam for many years (Dr. Audria Nine started) ? Anemia Depression   a.  had difficulty with antidepressants in the past (Effexor, Lexapro) with SE's; states does not want to take again Eczema EGD and Endo U/S done 7.21.2011   a. normal esoph; mild gastritis; normal CBD; GB absent; normal; no biliary stones; bx to check for H. pylori and celiac sprue

## 2010-12-05 NOTE — Letter (Signed)
Summary: *Referral Letter  [facility]  [address] [place], [place] [address]   Phone: [phone]  Fax: [phone]    03/28/2010  Thank you in advance for agreeing to see my patient:  Katie Little [address] [place], [place]  [address] Phone: [phone]  Reason for Referral: 35 yo female with h/o cholecystectomy in 12/2009 with epigastric pain.  She had this same pain before her surgery.  It is in a different spot in the epigastrium than pre-surgery, but it is the same pain.  No relation to meals.  No symptoms of acid reflux.  I performed lab work in April that included a CMET, CBC and H. pylori antibody.  All tests were unremarkable.  An abdominal ultrasound was also unremarkable except for a finding of nephrocalcinosis.  I placed her on an H2 receptor blocker and she felt like this made her feel worse so she stopped it.  Please evaluate.  Current Medical Problems: 1)  NEPHROCALCINOSIS (ICD-275.49) 2)  CHOLECYSTECTOMY, LAPAROSCOPIC, HX OF (ICD-V45.79) 3)  EPIGASTRIC PAIN (ICD-789.06) 4)  ECZEMA (ICD-692.9) 5)  FAMILY HISTORY DIABETES 1ST DEGREE RELATIVE (ICD-V18.0) 6)  DEPRESSION (ICD-311) 7)  ANXIETY (ICD-300.00) 8)  HYPOTHYROIDISM (ICD-244.9) 9)  CHOLELITHIASIS (ICD-574.20)  Current Medications: 1)  ALPRAZOLAM 0.5 MG TABS (ALPRAZOLAM) 1 by mouth every day as needed for anxiety 2)  SYNTHROID 75 MCG TABS (LEVOTHYROXINE SODIUM) 1 by mouth every morning on a empty stomach 3)  CLOBETASOL PROPIONATE 0.05 % CREA (CLOBETASOL PROPIONATE) apply two times a day to eczema as needed 4)  PEPCID 20 MG TABS (FAMOTIDINE) Take 1 tablet by mouth two times a day  Past Medical History: 1)  Cholelithiasis 2)  Hypothyroidism 3)    a. reports h/o Graves Disease; took meds and then became hypothyroid 4)  Anxiety 5)    a. on alprazolam for many years (Dr. Audria Nine started) 6)  ? Anemia 7)  Depression 8)    a.  had difficulty with antidepressants in the past (Effexor,  Lexapro) with SE's; states does not want to take again 9)  Eczema  Thank you again for agreeing to see our patient; please contact us if you have any further questions or need additional information.  Sincerely,  Tereso Newcomer PA-C

## 2010-12-05 NOTE — Miscellaneous (Signed)
Summary: rx  Clinical Lists Changes  Medications: Added new medication of CARAFATE 1 GM/10ML  SUSP (SUCRALFATE) take 10ml two times a day - Signed Rx of CARAFATE 1 GM/10ML  SUSP (SUCRALFATE) take 10ml two times a day;  #1 month x 2;  Signed;  Entered by: Rachael Fee MD;  Authorized by: Rachael Fee MD;  Method used: Print then Give to Patient    Prescriptions: CARAFATE 1 GM/10ML  SUSP (SUCRALFATE) take 10ml two times a day  #1 month x 2   Entered and Authorized by:   Rachael Fee MD   Signed by:   Rachael Fee MD on 05/25/2010   Method used:   Print then Give to Patient   RxID:   [id number]

## 2010-12-05 NOTE — Progress Notes (Signed)
Summary: ULTRASOUND REFERRAL  Phone Note Call from Patient Call back at Home Phone [phone]   Reason for Call: Referral Summary of Call: [facility] pt. Ms Zbikowski is calling and checking on her referral to see when she will be getting her ultrasound. Initial call taken by: Leodis Rains,  February 27, 2010 9:16 AM  Follow-up for Phone Call        Left message on answering machine for pt to call back.Marland KitchenMarland KitchenMarland KitchenArmenia Shannon  February 27, 2010 12:40 PM [place] on May 4 @ 9am..... [place] Shannon  February 27, 2010 3:42 PM   pt is aware Follow-up by: [place] Shannon,  February 27, 2010 3:52 PM

## 2010-12-05 NOTE — Progress Notes (Signed)
Summary: meds too expensive  Phone Note Call from Patient Call back at Home Phone [phone]   Caller: Patient Call For: Dr Christella Hartigan Reason for Call: Talk to Nurse Summary of Call: Patient states that the meds sent to her pharmacy yesterday is too expensive, wants to know if theres anything similar but cheaper. Initial call taken by: Tawni Levy,  June 27, 2010 12:55 PM  Follow-up for Phone Call        spoke to the pharmacy and they do not have her medicaid card on file.  The rx was also changed to a can instead of single dose packs.  left message on machine to call back  Follow-up by: Chales Abrahams CMA Duncan Dull),  June 27, 2010 1:44 PM  Additional Follow-up for Phone Call Additional follow up Details #1::        pt advised , she says she will not be able to buy this because the price is still 55 dollars.  What else can she try? Additional Follow-up by: Chales Abrahams CMA Duncan Dull),  June 27, 2010 3:32 PM    Additional Follow-up for Phone Call Additional follow up Details #2::    she can stick with immodium for the diarrhrea...take one pill twice a day on a scheduled basis and only decrease or hold if she becomes constipated. Follow-up by: Rachael Fee MD,  June 27, 2010 3:34 PM  Additional Follow-up for Phone Call Additional follow up Details #3:: Details for Additional Follow-up Action Taken: left message on machine to call back x2 Chales Abrahams CMA Duncan Dull)  June 28, 2010 9:04 AM   pt aware Chales Abrahams CMA Duncan Dull)  June 28, 2010 9:44 AM

## 2010-12-05 NOTE — Letter (Signed)
Summary: [facility]  HEALTHCARE [facility]   Imported By: Arta Bruce 08/30/2010 15:30:28  _____________________________________________________________________  External Attachment:    Type:   Image     Comment:   External Document

## 2010-12-05 NOTE — Progress Notes (Signed)
Summary: Office Visit//DEPRESSION SCREENING  Office Visit//DEPRESSION SCREENING   Imported By: Arta Bruce 04/24/2010 16:47:24  _____________________________________________________________________  External Attachment:    Type:   Image     Comment:   External Document

## 2010-12-05 NOTE — Progress Notes (Signed)
Summary: GYN and Nephrology referrals  Phone Note Outgoing Call   Summary of Call: 1. Needs referral to GYN for placement of IUD.  2. Needs referral to nephrology for nephrocalcinosis.  Of note, her medicaid runs out in July.  If there is a way to get her in before that especially with the nephrologist, that would be helpful. Initial call taken by: Brynda Rim,  April 20, 2010 5:27 PM

## 2010-12-05 NOTE — Progress Notes (Signed)
Summary: WOKE UP W/BACK PAIN  Phone Note Call from Patient Call back at Home Phone [phone]   Reason for Call: Acute Illness Summary of Call: Katie Little PT. MS Freilich SAYS THAT SHE WOKE UP THIS MORNING WITH BACK PAIN. SHE CANNOT SIT OR LAYDOWN BECAUSE IT IS VERY UNCOMFORTABLE. SHE CAN ONLY STAND. SHE SAYS THE PAIN IS ON HER LEFT SIDE Initial call taken by: Leodis Rains,  Mar 09, 2010 11:50 AM  Follow-up for Phone Call        Left message on answering machine for pt to call back.Marland KitchenMarland KitchenMarland KitchenArmenia Shannon  Mar 09, 2010 2:37 PM  spoke with pt and she said the pain is in her back and it feels as if she did anything but she knows she doesn't.... pt says its her lower back.Silva Bandy Elwell says she has took ibuprofen... no discharge, no pain when urinating and not urinating frequently.. [facility] [address]... [place] Shannon  Mar 09, 2010 2:42 PM   Additional Follow-up for Phone Call Additional follow up Details #1::        needs appt Additional Follow-up by: Brynda Rim,  Mar 09, 2010 5:11 PM    Additional Follow-up for Phone Call Additional follow up Details #2::    pt went to urgent care and she recieved a shot of tradol and rx for flexril, vicodin and tramadol..pt says she is doing a lot better.. informed pt that if she doesn't do well just calll to schedule appt.Marland KitchenMarland KitchenArmenia Shannon  Mar 10, 2010 9:13 AM

## 2010-12-05 NOTE — Progress Notes (Signed)
Summary: GI referral  Phone Note Outgoing Call   Summary of Call: Needs GI referral for epigastric pain after cholecystectomy.  Referral letter in system. Initial call taken by: Brynda Rim,  Mar 28, 2010 5:26 PM

## 2010-12-05 NOTE — Letter (Signed)
Summary: TEST ORDER FORM//ULTRASOUND//APPT SDATE & TIME  TEST ORDER FORM//ULTRASOUND//APPT SDATE & TIME   Imported By: Arta Bruce 03/30/2010 11:42:58  _____________________________________________________________________  External Attachment:    Type:   Image     Comment:   External Document

## 2010-12-05 NOTE — Letter (Signed)
Summary: MED/SOLUTIONS//APPROVED  MED/SOLUTIONS//APPROVED   Imported By: Arta Bruce 05/15/2010 10:04:44  _____________________________________________________________________  External Attachment:    Type:   Image     Comment:   External Document

## 2010-12-05 NOTE — Progress Notes (Signed)
Summary: Query:  Refill Synthroid?  Phone Note Outgoing Call   Summary of Call: I asked for approval for this pt.'s Rx for Synthroid 75 micrograms.  States she is only coming to this office and has no other provider.  Phone note got deleted inadvertently.   Initial call taken by: Dutch Quint RN,  July 13, 2010 12:23 PM  Follow-up for Phone Call        ok to refill synthroid x 6 months.  Follow-up by: Tereso Newcomer PA-C,  July 13, 2010 1:21 PM  Additional Follow-up for Phone Call Additional follow up Details #1::        Noted.  Dutch Quint RN  July 13, 2010 1:31 PM     Additional Follow-up for Phone Call Additional follow up Details #2::    pt called and said her rx was not sent to pharmacy..... theresa did not send script to pharmacy.... [facility] on [address].... [place] Shannon  July 13, 2010 4:27 PM   pt is aware... [place] Shannon  July 14, 2010 8:20 AM   Prescriptions: SYNTHROID 75 MCG TABS (LEVOTHYROXINE SODIUM) 1 by mouth every morning on a empty stomach  #30 x 5   Entered and Authorized by:   Tereso Newcomer PA-C   Signed by:   Tereso Newcomer PA-C on 07/13/2010   Method used:   Electronically to        [id number] (retail)       [address] [place], [place]  [address]       Ph: [phone]       Fax: [phone]   RxID:   [id number]

## 2010-12-05 NOTE — Assessment & Plan Note (Signed)
Summary: BLOOD WORK//GK   Vital Signs:  Patient profile:   35 year old female Height:      63 inches Weight:      144 pounds BMI:     25.60 Temp:     98.8 degrees F oral Pulse rate:   92 / minute Pulse rhythm:   regular Resp:     18 per minute BP sitting:   121 / 77  (left arm) Cuff size:   regular  Vitals Entered By: [place] Shannon (Mar 28, 2010 4:48 PM) CC: pt is here for f/u on stomach pain... pt says the pepcid is not working.. Is Patient Diabetic? No Pain Assessment Patient in pain? no       Does patient need assistance? Functional Status Self care Ambulation Normal   Primary Care Provider:  Tereso Newcomer, PA-C  CC:  pt is here for f/u on stomach pain... pt says the pepcid is not working...  History of Present Illness: Here for f/u on epigastric pain. No help with pepcid.  Says she got indigestion from it and stopped. Epigastric pain comes on at any time.  No real relation to meals.  May last 2 hours.  Not severe enough to make her stop activity.  Describes some crampy pain at times.  No diarrhea.  No constipation.  She is passing flatus.  Denies alcohol abuse.  No vomiting.  No dysphagia.  No dypepsia.  No cough.   Her CBC, CMET and H. pylori tests were all unremarkable.  The hepatic and pancreatic portion of her abdominal ultrasound were unremarkable.   She notes this pain before her cholecystectomy and after it as well.  It is just in a different spot but all pain has been in epigastric area.  Allergies: No Known Drug Allergies  Physical Exam  General:  alert, well-developed, and well-nourished.   Head:  normocephalic and atraumatic.   Neck:  supple.   Lungs:  normal breath sounds.   Heart:  normal rate and regular rhythm.   Abdomen:  soft, normal bowel sounds, no hepatomegaly, and epigastric tenderness.   Neurologic:  alert & oriented X3 and cranial nerves II-XII intact.   Psych:  normally interactive.     Impression & Recommendations:  Problem # 1:   EPIGASTRIC PAIN (ICD-789.06) workup to this point unremarkable ? if she is having Sphincter of Odi dysfunction . . .although she does not meet all symptom criteria will refer to GI for further eval check lipase  Orders: T-Lipase ([id number]) Gastroenterology Referral (GI)  Problem # 2:  NEPHROCALCINOSIS (ICD-275.49)  had repeat 24 hour urine done today needs bmet with it  Orders: T-Basic Metabolic Panel ([id number])  Complete Medication List: 1)  Alprazolam 0.5 Mg Tabs (Alprazolam) .Marland Kitchen.. 1 by mouth every day as needed for anxiety 2)  Synthroid 75 Mcg Tabs (Levothyroxine sodium) .Marland Kitchen.. 1 by mouth every morning on a empty stomach 3)  Clobetasol Propionate 0.05 % Crea (Clobetasol propionate) .... Apply two times a day to eczema as needed 4)  Pepcid 20 Mg Tabs (Famotidine) .... Take 1 tablet by mouth two times a day  Patient Instructions: 1)  Someone will call you to arrange the referral to Gastroenterology.

## 2010-12-05 NOTE — Letter (Signed)
Summary: New Patient letter  [facility]  [address] [place], [place] [address]   Phone: [phone]  Fax: [phone]       03/30/2010 MRN: [id number]  Katie Little [address] [place], [place]  [address]  Dear Ms. Gockley,  Welcome to the Gastroenterology Division at [facility].    You are scheduled to see Dr.  Christella Hartigan on 05-05-10 at 10:00a.m. on the 3rd floor at [facility], [address]  We ask that you try to arrive at our office 15 minutes prior to your appointment time to allow for check-in.  We would like you to complete the enclosed self-administered evaluation form prior to your visit and bring it with you on the day of your appointment.  We will review it with you.  Also, please bring a complete list of all your medications or, if you prefer, bring the medication bottles and we will list them.  Please bring your insurance card so that we may make a copy of it.  If your insurance requires a referral to see a specialist, please bring your referral form from your primary care physician.  Co-payments are due at the time of your visit and may be paid by cash, check or credit card.     Your office visit will consist of a consult with your physician (includes a physical exam), any laboratory testing he/she may order, scheduling of any necessary diagnostic testing (e.g. x-ray, ultrasound, CT-scan), and scheduling of a procedure (e.g. Endoscopy, Colonoscopy) if required.  Please allow enough time on your schedule to allow for any/all of these possibilities.    If you cannot keep your appointment, please call [phone] to cancel or reschedule prior to your appointment date.  This allows [place] the opportunity to schedule an appointment for another patient in need of care.  If you do not cancel or reschedule by 5 p.m. the business day prior to your appointment date, you will be charged a $50.00 late cancellation/no-show fee.    Thank you for choosing   Gastroenterology for your medical needs.  We appreciate the opportunity to care for you.  Please visit [place] at our website  to learn more about our practice.                     Sincerely,                                                             The Gastroenterology Division

## 2010-12-05 NOTE — Letter (Signed)
Summary: EGD Instructions  [facility]  [address] [place], [place] [address]   Phone: [phone]  Fax: [phone]       Katie Little    01/01/1976    MRN: [id number]       Procedure Day /Date:05/25/10  EVOJJ     Arrival Time: 730 am     Procedure Time:830 am     Location of Procedure:                     [facility] ( Outpatient Registration)    PREPARATION FOR ENDOSCOPY   On 05/25/10 THE DAY OF THE PROCEDURE:  1.   No solid foods, milk or milk products are allowed after midnight the night before your procedure.  2.   Do not drink anything colored red or purple.  Avoid juices with pulp.  No orange juice.  3.  You may drink clear liquids until 430 am, which is 4 hours before your procedure.                                                                                                CLEAR LIQUIDS INCLUDE: Water Jello Ice Popsicles Tea (sugar ok, no milk/cream) Powdered fruit flavored drinks Coffee (sugar ok, no milk/cream) Gatorade Juice: apple, white grape, white cranberry  Lemonade Clear bullion, consomm, broth Carbonated beverages (any kind) Strained chicken noodle soup Hard Candy   MEDICATION INSTRUCTIONS  Unless otherwise instructed, you should take regular prescription medications with a small sip of water as early as possible the morning of your procedure.             OTHER INSTRUCTIONS  You will need a responsible adult at least 33 years of age to accompany you and drive you home.   This person must remain in the waiting room during your procedure.  Wear loose fitting clothing that is easily removed.  Leave jewelry and other valuables at home.  However, you may wish to bring a book to read or an iPod/MP3 player to listen to music as you wait for your procedure to start.  Remove all body piercing jewelry and leave at home.  Total time from sign-in until discharge is approximately 2-3 hours.  You should go home  directly after your procedure and rest.  You can resume normal activities the day after your procedure.  The day of your procedure you should not:   Drive   Make legal decisions   Operate machinery   Drink alcohol   Return to work  You will receive specific instructions about eating, activities and medications before you leave.    The above instructions have been reviewed and explained to me by   _______________________    I fully understand and can verbalize these instructions _____________________________ Date _________

## 2010-12-05 NOTE — Letter (Signed)
Summary: TEST ORDER//ULTRASOUND//APPT DATE & TIME  TEST ORDER//ULTRASOUND//APPT DATE & TIME   Imported By: Arta Bruce 03/22/2010 09:12:09  _____________________________________________________________________  External Attachment:    Type:   Image     Comment:   External Document

## 2010-12-05 NOTE — Progress Notes (Signed)
Summary: Medicine not helping  Phone Note Call from Patient Call back at Home Phone [phone]   Call For: Dr Christella Hartigan Reason for Call: Talk to Nurse Summary of Call: Medicine she was put on is not really helping and she feels like its causing more GI problems, would like to discuss with nurse please.  Initial call taken by: Leanor Kail [facility],  June 22, 2010 12:43 PM  Follow-up for Phone Call        left message on machine to call back Chales Abrahams CMA Harlem Bula Dull)  June 22, 2010 1:05 PM   pt says the Levsin is not helping, she has diarrhea since her gallbladder surgery but since being put on Levsin it has increased to everday.  Carafate has not helped, she is trying Immodium starting today and is not sure if this helps yet.  She wanted Dr Christella Hartigan to know that she has begun the Immodium and should she stop the Levsin?   Follow-up by: Chales Abrahams CMA Radley Teston Dull),  June 22, 2010 1:11 PM  Additional Follow-up for Phone Call Additional follow up Details #1::        Left message on home phone at 1:15pm. Asked patient to call Lowry Ram to discuss medications. Additional Follow-up by: Willette Cluster NP,  June 23, 2010 1:18 PM    Additional Follow-up for Phone Call Additional follow up Details #2::    Patient returned my call around 3pm but seeing patients then. I attempted to reach her back just before 6pm (cell phone). Left message on voice mail. She can call us Monday or if need be, MD on call over weekend  Follow-up by: Willette Cluster NP,  June 23, 2010 6:03 PM  Additional Follow-up for Phone Call Additional follow up Details #3:: Details for Additional Follow-up Action Taken: she should stop the levsin and try cholestryamine 4gram powder, take once every morning (disp one month with 3 refills).  This is very good at treated post-cholecystectomy diarrhea Additional Follow-up by: Rachael Fee MD,  June 25, 2010 11:38 AM  New/Updated Medications: CHOLESTYRAMINE 4 GM/DOSE POWD  (CHOLESTYRAMINE) 4 gm by mouth q am  DO NOT TAKE WITHIN 2 HOURS OF OTHER MEDS Prescriptions: CHOLESTYRAMINE 4 GM/DOSE POWD (CHOLESTYRAMINE) 4 gm by mouth q am  DO NOT TAKE WITHIN 2 HOURS OF OTHER MEDS  #1 month x 3   Entered by:   Chales Abrahams CMA (AAMA)   Authorized by:   Rachael Fee MD   Signed by:   Chales Abrahams CMA (AAMA) on 06/26/2010   Method used:   Electronically to        [facility]  [address] [id number]* (retail)       [address]       [place], [place]  [address]       Ph: [phone] or [phone]       Fax: [phone]   RxID:   [id number]  left message on machine to call back Chales Abrahams CMA (AAMA)  June 26, 2010 9:05 AM   pt aware rx sent

## 2010-12-05 NOTE — Progress Notes (Signed)
Summary: Oximetry results  Phone Note Call from Patient   Summary of Call: pt says she did a oxygen level for [facility] and has not recieve any results back yet... called [facility] and they did not have anything on this pt Initial call taken by: Arm[place] Shannon,  August 24, 2010 10:29 AM  Follow-up for Phone Call        spoke with Selena Batten at [facility] and she is seeing if she can find any information about the visit... Armenia Shannon  August 25, 2010 9:14 AM  spoke with kim and she found the report and is faxing me the results.Marland KitchenMarland KitchenMarland KitchenMarland Kitchen will make sure it get in her corrsponence.Marland KitchenMarland KitchenArmenia Shannon  August 25, 2010 9:57 AM   Additional Follow-up for Phone Call Additional follow up Details #1::        i looked at results and nothing remarkable. pt mostly stayed with Oxygen sats greater than 90 (which is where it should be - 9 hrs and 54 mins) She did have 11 minutes when she went less than 80% sat but that was likely at the peak of her sleep.  This is not every significant considering it did not last during the night and was not recurrent Additional Follow-up by: Lehman Prom FNP,  August 29, 2010 6:41 PM    Additional Follow-up for Phone Call Additional follow up Details #2::    Left message on answering machine for pt to call back.Marland KitchenMarland KitchenMarland KitchenArmenia Shannon  August 30, 2010 12:26 PM   spoke with pt and she said that the reason why scott did this test is because she is fatigue alot... pt says scott first step was to do this and if this was positive she was going to do a sleep study since this is negatuve pt would like to know what is the next step...Marland KitchenMarland KitchenMarland Kitchen [place] Shannon  August 31, 2010 11:20 AM   Additional Follow-up for Phone Call Additional follow up Details #3:: Details for Additional Follow-up Action Taken: Answer may be lifestyle changes - She does not meet classic sleep apnea criteria (not obese, not snoring, not headache) Pt may need to look at diet - avoid sweet  and junk foods. Over time the saturated fats and fatty foods make you tired.  She should try to eat more fruit and veges.  Drink water instead of sodas and juice - avoid equal and sweet and low. THIS IS REALLY BIG - also avoid caffiene as this is stimulant - over time this actually makes your tired (decrease coffee and sodas and energy drinks) Exercise is key - she will need to fit it into her schedule (either before and after work) she needs to walk for 10-15 minutes per day Stress that lifestyle changes are key at this tpoing   Left message on answering machine for pt to call back.Marland KitchenMarland KitchenMarland KitchenArmenia Shannon  August 31, 2010 2:50 PM    spoke with pt and she is aware of advise... pt doesn't think this will solve the problem since she is already praciting these ideas... i advise pt to make appt..... Armenia Shannon  August 31, 2010 3:19 PM  Additional Follow-up by: Lehman Prom FNP,  August 31, 2010 1:58 PM

## 2010-12-05 NOTE — Letter (Signed)
Summary: *HSN Results Follow up  [facility]  [address] [place], [place] [address]   Phone: [phone]  Fax: [phone]      07/11/2010   Dari Rio [address] [place], [place]  [address]   Dear  Ms. Delane Mctier,                            ____S.Drinkard,FNP   ____D. Gore,FNP       ____B. McPherson,MD   ____V. Rankins,MD    ____E. Mulberry,MD    ____N. Daphine Deutscher, FNP  ____D. Reche Dixon, MD    ____K. Philipp Deputy, MD    ____Other     This letter is to inform you that your recent test(s):  _______Pap Smear    ___X____Lab Test     _______X-ray    ___X____ is within acceptable limits  _______ requires a medication change  _______ requires a follow-up lab visit  _______ requires a follow-up visit with your provider   Comments:       _________________________________________________________ If you have any questions, please contact our office                     Sincerely,  Armenia Shannon [facility]

## 2010-12-05 NOTE — Assessment & Plan Note (Signed)
Summary: NEW PAT--REESTABLISH CARE, MED REFILL//YC--MCD   Vital Signs:  Patient profile:   35 year old female Height:      63 inches Weight:      140.25 pounds BMI:     24.93 Temp:     98.8 degrees F Pulse rate:   80 / minute Pulse rhythm:   regular Resp:     20 per minute BP sitting:   102 / 72  (left arm) Cuff size:   regular  Vitals Entered By: Chauncy Passy, SMA CC: Pt. is here to establish herself w/ [facility]. Pt. has some concerns about her eczema. Also, she went to [facility] to have her gal bladder removed in Feb. 2011.  Is Patient Diabetic? No Pain Assessment Patient in pain? no       Does patient need assistance? Functional Status Self care Ambulation Normal   Primary Care Provider:  Tereso Newcomer, PA-C  CC:  Pt. is here to establish herself w/ [facility]. Pt. has some concerns about her eczema. Also and she went to [facility] to have her gal bladder removed in Feb. 2011. Marland Kitchen  History of Present Illness: Re-Estab. patient.  Came to HS years ago.  Recently seeing Dr. Para March at [place] on [facility].  Health Maint: Last pap in 2009.  Never abnormal. Never had mammo. Td shot in 2009.  Notes abdominal pain for the last several weeks.  Seems similar to biliary colic.  Located in different area.  Not really assoc. with meals but patient not sure.  No assoc n/v.  No melena or hematochezia.  No improvement with eating.  No dysphagia or water brash.  No dyspepsia.  No heavy caffeine use.  Notes eczema on hands.  Has used clobetasol in the past with relief.    Habits & Providers  Alcohol-Tobacco-Diet     Alcohol drinks/day: <1     Tobacco Status: current     Cigarette Packs/Day: 1.0     Pack years: 20  Exercise-Depression-Behavior     Drug Use: no  Current Medications (verified): 1)  Alprazolam 0.5 Mg Tabs (Alprazolam) .Marland Kitchen.. 1 By Mouth Every Day As Needed For Anxiety 2)  Synthroid 75 Mcg Tabs (Levothyroxine Sodium) .Marland Kitchen.. 1 By Mouth Every Morning On A Empty  Stomach  Allergies (verified): No Known Drug Allergies  Past History:  Past Medical History: Cholelithiasis Hypothyroidism   a. reports h/o Graves Disease; took meds and then became hypothyroid Anxiety   a. on alprazolam for many years (Dr. Audria Nine started) ? Anemia Depression   a.  had difficulty with antidepressants in the past (Effexor, Lexapro) with SE's; states does not want to take again Eczema  Family History: Family History Diabetes 1st degree relative - Dad Skin CA - Grandfather Leukemia - Grandmother No colon, breast, ovarian cancer. 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 Resp:  Denies cough.  Physical Exam  General:  alert, well-developed, and well-nourished.   Head:  normocephalic and atraumatic.   Eyes:  pupils equal, pupils round, and pupils reactive to light.  sclerae clear bilat  Mouth:  pharynx pink and moist.   Neck:  supple and no thyromegaly.   Lungs:  normal breath  sounds.   Heart:  normal rate and regular rhythm.   Abdomen:  soft, normal bowel sounds, no distention, no masses, no guarding, no hepatomegaly, and epigastric tenderness.   Neurologic:  alert & oriented X3 and cranial nerves II-XII intact.   Skin:  drying and cracking bilat hands c/w eczema  Psych:  normally interactive.     Impression & Recommendations:  Problem # 1:  ECZEMA (ICD-692.9)  mainly on her hands has used clobetasol cream in the past with good success will refill for her  Her updated medication list for this problem includes:    Clobetasol Propionate 0.05 % Crea (Clobetasol propionate) .Marland Kitchen... Apply two times a day to eczema as needed  Problem # 2:   HYPOTHYROIDISM (ICD-244.9)  Her updated medication list for this problem includes:    Synthroid 75 Mcg Tabs (Levothyroxine sodium) .Marland Kitchen... 1 by mouth every morning on a empty stomach  Orders: T-TSH ([id number])  Problem # 3:  Preventive Health Care (ICD-V70.0) eventually schedule CPP  Problem # 4:  EPIGASTRIC PAIN (ICD-789.06)  she is having pain similar to what she had with her gallbladder but, it is in a different location ? retained stone vs. gastritis start H2RA and check for H. pylori get abd u/s to r/o retained stone check LFTs f/u 4-6 weeks  Orders: T-Comprehensive Metabolic Panel ([id number]) T-CBC w/Diff ([id number]) Ultrasound (Ultrasound) T-Helicobacter AB - IgG ([id number])  Complete Medication List: 1)  Alprazolam 0.5 Mg Tabs (Alprazolam) .Marland Kitchen.. 1 by mouth every day as needed for anxiety 2)  Synthroid 75 Mcg Tabs (Levothyroxine sodium) .Marland Kitchen.. 1 by mouth every morning on a empty stomach 3)  Clobetasol Propionate 0.05 % Crea (Clobetasol propionate) .... Apply two times a day to eczema as needed 4)  Pepcid 20 Mg Tabs (Famotidine) .... Take 1 tablet by mouth two times a day  Patient Instructions: 1)  Take the Pepcid 2 tablets two times a day for 7 days, then go to one tablet two times a day. 2)  Try to avoid caffeine if at all possible.  Also, avoid alcohol. 3)  Tobacco is very bad for your health and your loved ones ! You should stop smoking !  4)  Stop smoking tips: Choose a quit date. Cut down before the quit date. Decide what you will do as a substitute when you feel the urge to smoke(gum, toothpick, exercise).  5)  Schedule follow up with [facility] in 4-6 weeks for stomach pain.  Return sooner if worse. 6)  Schedule CPP with Marlyce Mcdougald in 3 months. 7)  Sign forms to get records from prior PCP at [facility] on [facility]. Prescriptions: PEPCID 20 MG TABS (FAMOTIDINE) Take 1 tablet by mouth two times a day  #60 x 3   Entered and Authorized by:   Tereso Newcomer PA-C   Signed  by:   Tereso Newcomer PA-C on 02/16/2010   Method used:   Print then Give to Patient   RxID:   [id number] CLOBETASOL PROPIONATE 0.05 % CREA (CLOBETASOL PROPIONATE) apply two times a day to eczema as needed  #30 grams x 3   Entered and Authorized by:   Tereso Newcomer PA-C   Signed by:   Tereso Newcomer PA-C on 02/16/2010   Method used:   Print then Give to Patient   RxID:   [id number]

## 2010-12-05 NOTE — Miscellaneous (Signed)
Summary: Nephrocalcinosis f/u . Marland Kitchen Marland Kitchen needs to see nephrology  Clinical Lists Changes  Problems: Assessed NEPHROCALCINOSIS as comment only - urinary Ca elevated x 1; 2nd 24 hr urine with normal calcium other values in normal range spoke with Dr. Hyman Hopes at [facility] he suggested getting a PTH and a CXR ( to screen for sarcoid) he also felt she should see nephrology patient comes in for CPP today will discuss then and make referral       Impression & Recommendations:  Problem # 1:  NEPHROCALCINOSIS (ICD-275.49) urinary Ca elevated x 1; 2nd 24 hr urine with normal calcium other values in normal range spoke with Dr. Hyman Hopes at [facility] he suggested getting a PTH and a CXR ( to screen for sarcoid) he also felt she should see nephrology patient comes in for CPP today will discuss then and make referral  Complete Medication List: 1)  Alprazolam 0.5 Mg Tabs (Alprazolam) .Marland Kitchen.. 1 by mouth every day as needed for anxiety 2)  Synthroid 75 Mcg Tabs (Levothyroxine sodium) .Marland Kitchen.. 1 by mouth every morning on a empty stomach 3)  Clobetasol Propionate 0.05 % Crea (Clobetasol propionate) .... Apply two times a day to eczema as needed 4)  Pepcid 20 Mg Tabs (Famotidine) .... Take 1 tablet by mouth two times a day

## 2010-12-12 ENCOUNTER — Telehealth (INDEPENDENT_AMBULATORY_CARE_PROVIDER_SITE_OTHER): Payer: Self-pay | Admitting: Nurse Practitioner

## 2010-12-21 NOTE — Progress Notes (Signed)
Summary: Query:  Refill clobetasol?  Phone Note Outgoing Call   Summary of Call: Last seen 07/2010.  Refill clobetasol per protocol?  For eczema as needed.  Initial call taken by: Dutch Quint RN,  December 12, 2010 5:20 PM  Follow-up for Phone Call        yes, ok to refill Follow-up by: Lehman Prom FNP,  December 13, 2010 8:09 AM  Additional Follow-up for Phone Call Additional follow up Details #1::        Noted.  Refill completed.  Dutch Quint RN  December 13, 2010 1:10 PM     Prescriptions: CLOBETASOL PROPIONATE 0.05 % CREA (CLOBETASOL PROPIONATE) apply two times a day to eczema as needed  #30 grams x 3   Entered by:   Dutch Quint RN   Authorized by:   Lehman Prom FNP   Signed by:   Dutch Quint RN on 12/13/2010   Method used:   Electronically to        [id number]* (retail)       [address] [place], [place]  [address]       Ph: [phone]       Fax: [phone]   RxID:   [id number]

## 2010-12-28 ENCOUNTER — Telehealth: Payer: Self-pay | Admitting: Family Medicine

## 2010-12-28 ENCOUNTER — Ambulatory Visit (INDEPENDENT_AMBULATORY_CARE_PROVIDER_SITE_OTHER): Payer: Self-pay | Admitting: Family Medicine

## 2010-12-28 ENCOUNTER — Encounter: Payer: Self-pay | Admitting: Family Medicine

## 2010-12-28 DIAGNOSIS — F411 Generalized anxiety disorder: Secondary | ICD-10-CM

## 2010-12-28 DIAGNOSIS — E039 Hypothyroidism, unspecified: Secondary | ICD-10-CM

## 2010-12-28 DIAGNOSIS — L259 Unspecified contact dermatitis, unspecified cause: Secondary | ICD-10-CM

## 2011-01-02 NOTE — Assessment & Plan Note (Signed)
Summary: new p to est, previous pt at [facility] jrt   Vital Signs:  Patient profile:   35 year old female Height:      62.5 inches Weight:      133.75 pounds Temp:     99.0 degrees F oral Pulse rate:   76 / minute Pulse rhythm:   regular BP sitting:   110 / 70  (left arm) Cuff size:   regular  Vitals Entered By: Sydell Axon LPN (December 28, 2010 2:11 PM) CC: New patient from [place], [facility]   History of Present Illness: Fatigue- needs to get back on thyroid meds and then recheck TSH.  Normal overnight pulse oximetry prev documented.  Still has episodic fatigue without a clear trigger.    Anxiety- mood "OK".  Sig change with relationship recently, now single.  Roommate is moving out.  Working and raising a son.  No SI/HI.  Had used alprazolam prev, had some relief with this. Intolerant of mult antipressants.  Off this for several months now.    H/o renal calcifications.  No known h/o stones per patient.  H/o 'kidney infection'  ~2004 with UTI/fever/vomiting.  This is the only known episode where this happened.  Prev labs noted.   H/o oral HSV.  Fewer outbreaks now.  Episodic tx.  Doesn't need a refill.    Allergies (verified): No Known Drug Allergies  Past History:  Past Surgical History: Last updated: 05/05/2010 Cholecystectomy (12/2009)  Caesarean section  Past Medical History: Cholelithiasis s/p 2011 laparoscopic cholecystectomy with normal Intra-Op cholangiogram Hypothyroidism- h/o Graves Disease; took meds and then became hypothyroid Anxiety h/o episodic alprazolam use Anemia Depression-intolerant of Effexor, Lexapro in the past with SE's; states does not want to take again Eczema EGD and Endo U/S done 7.21.2011- normal esoph; mild gastritis; normal CBD; GB absent; normal; no biliary stones; bx to check for H. pylori and celiac sprue Fatigue with h/o normal overnight pulse oximetry nephrocalcinosis with h/o normal PTH/Cr/urine calcium  Family History: Reviewed  history from 05/05/2010 and no changes required. Family History Diabetes 1st degree relative - Dad Skin CA - Grandfather Leukemia - Grandmother No colon, breast, ovarian cancer. Heart Disease - grandparents  F alive, DM M alive, 'heart trouble', HLD, HTN, kidney stones  Social History: Reviewed history from 05/05/2010 and no changes required. 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  Otherwise negative.    Physical Exam  General:  GEN: nad, alert and oriented HEENT: mucous membranes moist NECK: supple w/o LA CV: rrr.  no murmur PULM: ctab, no inc wob ABD: soft, +bs EXT: no edema SKIN: no acute rash but chronic eczematous changes noted on the fingers.    Impression & Recommendations:  Problem # 1:  NEPHROCALCINOSIS (ICD-275.49) I called Dr. Lowell Guitar with renal.  Pt has stable Cr, normal PTH and normal calcium excretion on prev studies.  She isn't hypertensive.  At this point, there doesn't appear to be intervention needed other than staying well hydrated.  It would be reasonable to recheck Cr periodically and monitor BP.    Problem # 2:  ECZEMA (ICD-692.9) AAA two times a day with topical steroid.   Her updated medication list for this problem includes:    Fluocinonide 0.05 % Crea (Fluocinonide) .Marland Kitchen... Aaa two times a day as needed for eczema  Problem #  3:  ANXIETY (ICD-300.00) She has an old rx waiting at the pharmacy.  I called about this.  She can get it filled.  No new rx today.  use as needed.  Overall her situation is improved from prev, with prev difficult relationships. Safe at home w/o SI/HI Her updated medication list for this problem includes:    Alprazolam 0.5 Mg Tabs (Alprazolam) .Marland Kitchen... 1 by mouth every day as needed for anxiety  Problem # 4:  HYPOTHYROIDISM (ICD-244.9) Restart meds.  no TMG on exam  today.  Return for labs. She agrees.  Her updated medication list for this problem includes:    Synthroid 75 Mcg Tabs (Levothyroxine sodium) .Marland Kitchen... 1 by mouth every morning on a empty stomach  Complete Medication List: 1)  Alprazolam 0.5 Mg Tabs (Alprazolam) .Marland Kitchen.. 1 by mouth every day as needed for anxiety 2)  Synthroid 75 Mcg Tabs (Levothyroxine sodium) .Marland Kitchen.. 1 by mouth every morning on a empty stomach 3)  Fluocinonide 0.05 % Crea (Fluocinonide) .... Aaa two times a day as needed for eczema 4)  Valtrex 500 Mg Tabs (Valacyclovir hcl) .... 4 tabs by mouth two times a day for one day at the sign of outbreak of cold sore 5)  Ibuprofen 200 Mg Tabs (Ibuprofen) .... Take one by mouth once daily as needed  PAP Screening:    Last PAP smear:  04/20/2010  Osteoporosis Risk Assessment:  Risk Factors for Fracture or Low Bone Density:   Race (White or Asian):     yes   Smoking status:       current   Thyroid replacement:     yes   Thyroid disease:     yes  Immunization & Chemoprophylaxis:    Tetanus vaccine: historical per patient  (10/11/2008)    Pneumovax: Pneumovax  (12/22/2009)  Patient Instructions: 1)  I sent your meds to [facility].   2)  We'll request your records from [place]. 3)  I'll talk to the kidney doctors and then notify you. 4)  Come back for a TSH in 2 months.  244.9 5)  Call me if the fatigue continues.   Prescriptions: SYNTHROID 75 MCG TABS (LEVOTHYROXINE SODIUM) 1 by mouth every morning on a empty stomach  #30 x 5   Entered and Authorized by:   Crawford Givens MD   Signed by:   Crawford Givens MD on 12/28/2010   Method used:   Electronically to        [facility] [address] [phone]* (retail)       [address] [place], [place]  [address]       Ph: [phone]       Fax: [phone]   RxID:   [id number] FLUOCINONIDE 0.05 % CREA (FLUOCINONIDE) AAA two times a day as needed for eczema  #60g x 5   Entered and Authorized by:   Crawford Givens MD    Signed by:   Crawford Givens MD on 12/28/2010   Method used:   Electronically to        [facility] [address] (retail)       [address] [place], [place]  [address]       Ph: [phone]       Fax: [phone]   RxID:   [id number]    Orders Added: 1)  New Patient  Level II [99202]    Current Allergies (reviewed today): No known allergies

## 2011-01-02 NOTE — Progress Notes (Addendum)
  Phone Note Outgoing Call   Summary of Call: Please call patient.  I called and LMOVM.  I did some checking.  I looked back at her old labs and d/w one of the renal doctors.  She doesn't appear to need renal referral now.  We don't need to do anything other than episodically check her kidney function (and this was done a few months ago).  We can recheck it  ~yearly.  She should stay well hydrated in the meantime.  Nothing else to do now.  Thanks.  Crawford Givens MD  December 28, 2010 3:18 PM   Follow-up for Phone Call        Patient Advised.  Follow-up by: Delilah Shan CMA Prestyn Mahn Dull),  December 29, 2010 10:53 AM     Appended Document:     Clinical Lists Changes  Allergies: Added new allergy or adverse reaction of EFFEXOR Added new allergy or adverse reaction of WELLBUTRIN Added new allergy or adverse reaction of TRAZODONE HCL Observations: Added new observation of ALLERGY REV: Done (01/05/2011 0:10) Added new observation of NKA: F (01/05/2011 0:10)        Allergies (verified): 1)  ! Effexor 2)  ! Wellbutrin 3)  ! Trazodone Hcl

## 2011-01-15 ENCOUNTER — Encounter: Payer: Self-pay | Admitting: Family Medicine

## 2011-01-15 ENCOUNTER — Telehealth: Payer: Self-pay | Admitting: Family Medicine

## 2011-01-15 LAB — CBC
HCT: 40.2 % (ref 36.0–46.0)
Hemoglobin: 13.4 g/dL (ref 12.0–15.0)
MCHC: 33.3 g/dL (ref 30.0–36.0)
MCV: 89.3 fL (ref 78.0–100.0)
RBC: 4.5 MIL/uL (ref 3.87–5.11)
RDW: 13.1 % (ref 11.5–15.5)
WBC: 8.8 10*3/uL (ref 4.0–10.5)

## 2011-01-15 LAB — URINALYSIS, ROUTINE W REFLEX MICROSCOPIC
Bilirubin Urine: NEGATIVE
Leukocytes, UA: NEGATIVE
Nitrite: NEGATIVE
Protein, ur: NEGATIVE mg/dL

## 2011-01-15 LAB — URINE MICROSCOPIC-ADD ON

## 2011-01-16 NOTE — Letter (Signed)
Summary: [facility]   Imported By: Maryln Gottron 01/10/2011 13:57:53  _____________________________________________________________________  External Attachment:    Type:   Image     Comment:   External Document

## 2011-01-16 NOTE — Letter (Signed)
Summary: [facility]   Imported By: Maryln Gottron 01/10/2011 14:01:16  _____________________________________________________________________  External Attachment:    Type:   Image     Comment:   External Document

## 2011-01-16 NOTE — Letter (Signed)
Summary: [facility]   Imported By: Maryln Gottron 01/10/2011 13:59:42  _____________________________________________________________________  External Attachment:    Type:   Image     Comment:   External Document

## 2011-01-16 NOTE — Letter (Signed)
Summary: [facility]   Imported By: Maryln Gottron 01/10/2011 14:02:35  _____________________________________________________________________  External Attachment:    Type:   Image     Comment:   External Document

## 2011-01-21 LAB — COMPREHENSIVE METABOLIC PANEL
ALT: 21 U/L (ref 0–35)
AST: 19 U/L (ref 0–37)
Albumin: 4.1 g/dL (ref 3.5–5.2)
CO2: 23 mEq/L (ref 19–32)
Calcium: 8.9 mg/dL (ref 8.4–10.5)
Creatinine, Ser: 0.73 mg/dL (ref 0.4–1.2)
Glucose, Bld: 79 mg/dL (ref 70–99)
Potassium: 4.4 mEq/L (ref 3.5–5.1)
Total Bilirubin: 0.6 mg/dL (ref 0.3–1.2)
Total Protein: 7.5 g/dL (ref 6.0–8.3)

## 2011-01-21 LAB — URINE MICROSCOPIC-ADD ON

## 2011-01-21 LAB — CBC
MCHC: 34.4 g/dL (ref 30.0–36.0)
Platelets: 230 10*3/uL (ref 150–400)
RBC: 4.39 MIL/uL (ref 3.87–5.11)
RDW: 13.8 % (ref 11.5–15.5)
WBC: 12.3 10*3/uL — ABNORMAL HIGH (ref 4.0–10.5)

## 2011-01-21 LAB — URINALYSIS, ROUTINE W REFLEX MICROSCOPIC
Hgb urine dipstick: NEGATIVE
Ketones, ur: NEGATIVE mg/dL
Nitrite: NEGATIVE
Specific Gravity, Urine: 1.022 (ref 1.005–1.030)
Specific Gravity, Urine: 1.016 (ref 1.005–1.030)
pH: 6.5 (ref 5.0–8.0)

## 2011-01-21 LAB — DIFFERENTIAL
Basophils Relative: 0 % (ref 0–1)
Lymphocytes Relative: 24 % (ref 12–46)
Neutro Abs: 8.4 10*3/uL — ABNORMAL HIGH (ref 1.7–7.7)

## 2011-01-21 LAB — LIPASE, BLOOD: Lipase: 21 U/L (ref 11–59)

## 2011-01-23 LAB — POCT URINALYSIS DIP (DEVICE)
Nitrite: NEGATIVE
Specific Gravity, Urine: >=1.03 (ref 1.005–1.030)
Urobilinogen, UA: 0.2 mg/dL (ref 0.0–1.0)
pH: 5.5 (ref 5.0–8.0)

## 2011-01-23 NOTE — Progress Notes (Signed)
Summary: needs letter regarding health after gall bladder surgery  Phone Note Call from Patient Call back at Home Phone [phone]   Caller: Patient Summary of Call: Pt is asking for a letter stating that she had her gall bladder removed last year, and explaining that there  can be issues with her health afterwards, such as sudden urges for BM's.  She needs the letter for work and also to have as an expanation if needed- she got pulled over for speeding once because she had to get off the highway in a hurry.   Please call pt when letter is ready.              Katie Little CMA, AAMA  January 15, 2011 3:29 PM   Follow-up for Phone Call        please see following letter in chart.  please give to patient.  Crawford Givens MD  January 15, 2011 5:13 PM   Patient Advised. Letter  left at front desk. Katie Little CMA Katie Little)  January 16, 2011 8:17 AM

## 2011-01-23 NOTE — Letter (Signed)
Summary: Generic Letter  [place] [facility]  [address] [place], [place] [address]   Phone: [phone]  Fax: [phone]    01/15/2011  Katie Little [address] [place], [place]  [address]  [place]  To whom it may concern,  Katie Little has a history of gallbladder disease and needed to have her gallbladder removed.  After such an operation some patients will have recurrent/unpredictable GI symptoms, including the urgent need to have a bowel movement.  She has these symptoms.  Please make allowances for her as she deals with these symptoms.  Thank you.  Sincerely,   Crawford Givens MD

## 2011-01-24 LAB — DIFFERENTIAL
Eosinophils Relative: 1 % (ref 0–5)
Lymphocytes Relative: 22 % (ref 12–46)
Lymphs Abs: 2.6 10*3/uL (ref 0.7–4.0)
Monocytes Absolute: 0.8 10*3/uL (ref 0.1–1.0)
Monocytes Relative: 7 % (ref 3–12)

## 2011-01-24 LAB — CBC
MCV: 89.9 fL (ref 78.0–100.0)
Platelets: 227 10*3/uL (ref 150–400)
WBC: 11.8 10*3/uL — ABNORMAL HIGH (ref 4.0–10.5)

## 2011-01-24 LAB — COMPREHENSIVE METABOLIC PANEL
AST: 21 U/L (ref 0–37)
Albumin: 4.1 g/dL (ref 3.5–5.2)
Calcium: 9.2 mg/dL (ref 8.4–10.5)
Chloride: 110 mEq/L (ref 96–112)
Creatinine, Ser: 0.95 mg/dL (ref 0.4–1.2)
GFR calc Af Amer: >60 mL/min (ref >60)
Total Bilirubin: 0.4 mg/dL (ref 0.3–1.2)

## 2011-02-12 ENCOUNTER — Encounter: Payer: Self-pay | Admitting: Family Medicine

## 2011-02-12 ENCOUNTER — Telehealth: Payer: Self-pay | Admitting: *Deleted

## 2011-02-12 NOTE — Telephone Encounter (Signed)
I called patient.  She had normal w/u per report at outside ER [facility]).  She doesn't describe ominous findings- exertional CP, unilateral sx suggestive of CVA.  This may be anxiety related.  She is going to try prn 1/2-1 tab of xanax in meantime.  She doesn't have high likelihood of cardiac/pulmonary disease.  We can talk and I'll check her on Wed at OV.  She is concerned about pregnancy and has a neg otc Upreg.  We can repeat this if needed at the OV.  She agrees with the plan.

## 2011-02-12 NOTE — Telephone Encounter (Signed)
Patient was in ER on Saturday with SOB, feeling like she was going to pass out. They sent her home and told her that they didn't find anything wrong. EKG was normal. She has ER follow scheduled for wed, but she is concerned that she is still having symptoms. She is feeling light headed at times, having some sob, says that her vision is off.

## 2011-02-14 ENCOUNTER — Encounter: Payer: Self-pay | Admitting: Family Medicine

## 2011-02-14 ENCOUNTER — Ambulatory Visit (INDEPENDENT_AMBULATORY_CARE_PROVIDER_SITE_OTHER): Payer: Self-pay | Admitting: Family Medicine

## 2011-02-14 DIAGNOSIS — R109 Unspecified abdominal pain: Secondary | ICD-10-CM | POA: Insufficient documentation

## 2011-02-14 DIAGNOSIS — F411 Generalized anxiety disorder: Secondary | ICD-10-CM

## 2011-02-14 NOTE — Patient Instructions (Signed)
See Shirlee Limerick about your referral before your leave today. Don't use another person's meds.   Don't drink alcohol in excess.  Talk to GI and consider talking to psychaitry.   Take care.

## 2011-02-14 NOTE — Assessment & Plan Note (Addendum)
D/w pt.  I told her I couldn't rx controlled meds for a patient that was using mult opiates from other patients.  She understood.  I think she likely had a panic/anxiety episode.  Her tingling didn't match a typical nerve distribution and she had normal EKG at ER per report.  She has a normal exam today.  I think structural cardio/pulm disease is unlikely and d/w pt.  I encouraged psych fu since she has h/o anxiety and depression, now complicated by abdominal pain, polysubstance use.  No SI/HI. Okay for outpatient fu.

## 2011-02-14 NOTE — Assessment & Plan Note (Addendum)
She likely has a functional abdominal pain related to anxiety.  I encouraged GI fu.  Refer to GI for second opinion.

## 2011-02-14 NOTE — Progress Notes (Signed)
She was driving in [facility] and was 'just not feeling good, like I was going to pass out."  Chest pain that radiated to neck, started at rest. Had some tingling in upper chest and L side of neck.  She was sob at the time.  Wu at ER was wnl per patient.  H/o abdominal pain.  She went through GI eval (Dr. Christella Hartigan) and didn't get better with antispasmodic and carafate.  It was not clear where the pain was coming from.  She had EGD with negative biopsy.  Old records from EGD reviewed with patient.   "Self medicating" with other's percocet, morphine, hydrocodone since December 2011.  "I was taking it because I was in pain, stomach pain."  Episodic use.  Work- 'everything is fine.'  Home- living with a roommate, both of them with kids.  Roommate is moving out soon.  Drinking on the weekends, "losing count."  Not blacking out per patient.  No recent illicit use.    Meds, vitals, and allergies reviewed.   ROS: See HPI.  Otherwise, noncontributory.  GEN: nad, alert and oriented HEENT: mucous membranes moist NECK: supple w/o LA CV: rrr. No ectopy PULM: ctab, no inc wob ABD: soft, +bs EXT: no edema SKIN: no acute rash

## 2011-02-15 ENCOUNTER — Emergency Department: Payer: Self-pay | Admitting: Emergency Medicine

## 2011-02-15 ENCOUNTER — Telehealth: Payer: Self-pay | Admitting: *Deleted

## 2011-02-15 NOTE — Telephone Encounter (Signed)
I called and LMOVM for patient.

## 2011-02-15 NOTE — Telephone Encounter (Signed)
I called and LMOVM.  I will not be in clinic tomorrow and stated that on message. I asked for her to call back and let me know how I could best reach her next week. I will await return call.

## 2011-02-15 NOTE — Telephone Encounter (Signed)
Patient is asking if you could give her a call at your convenience. When I asked her if there was anything that I could help her with she said that she wants to discuss coming off her pain medication and that is all she would tell me.

## 2011-02-19 ENCOUNTER — Other Ambulatory Visit: Payer: Self-pay | Admitting: *Deleted

## 2011-02-19 DIAGNOSIS — E039 Hypothyroidism, unspecified: Secondary | ICD-10-CM

## 2011-02-22 ENCOUNTER — Telehealth: Payer: Self-pay | Admitting: *Deleted

## 2011-02-22 NOTE — Telephone Encounter (Signed)
Patient says that has seen you several times for chest pain. She says that this past time she went to the ER and they did a referral to a cardiologist. She wants to go to [facility] in [place], but they told her she would have to have a direct referral from [place]. She is asking if you would be willing to do so.

## 2011-02-26 ENCOUNTER — Other Ambulatory Visit: Payer: Self-pay

## 2011-02-28 ENCOUNTER — Encounter: Payer: Self-pay | Admitting: Cardiovascular Disease

## 2011-03-01 ENCOUNTER — Ambulatory Visit (INDEPENDENT_AMBULATORY_CARE_PROVIDER_SITE_OTHER): Payer: Self-pay | Admitting: Cardiovascular Disease

## 2011-03-01 ENCOUNTER — Encounter: Payer: Self-pay | Admitting: Cardiovascular Disease

## 2011-03-01 DIAGNOSIS — R0600 Dyspnea, unspecified: Secondary | ICD-10-CM

## 2011-03-01 DIAGNOSIS — F17200 Nicotine dependence, unspecified, uncomplicated: Secondary | ICD-10-CM

## 2011-03-01 DIAGNOSIS — F411 Generalized anxiety disorder: Secondary | ICD-10-CM

## 2011-03-01 DIAGNOSIS — R0609 Other forms of dyspnea: Secondary | ICD-10-CM

## 2011-03-01 DIAGNOSIS — R072 Precordial pain: Secondary | ICD-10-CM

## 2011-03-01 DIAGNOSIS — R079 Chest pain, unspecified: Secondary | ICD-10-CM | POA: Insufficient documentation

## 2011-03-01 DIAGNOSIS — Z720 Tobacco use: Secondary | ICD-10-CM | POA: Insufficient documentation

## 2011-03-01 NOTE — Assessment & Plan Note (Signed)
Counseled for less than 10 minutes on cessation.  Would be a candidate for Welbutrin and nicotine replacement if stress echo normal

## 2011-03-01 NOTE — Patient Instructions (Signed)
Your physician has requested that you have a stress echocardiogram. For further information please visit [email or web address] Please follow instruction sheet as given.  Your physician recommends that you schedule a follow-up appointment in: as needed with Dr. Eden Emms

## 2011-03-01 NOTE — Progress Notes (Signed)
Anxious 35 yo with previous history of atypical SSCP.  Recurrent with sharp pain not related to exercise.  Dyspnea that seems functional.  Palpitations  as well.  No documented heart disease with previously normal echo.  Denies history of anemia, or SVT/arrhythmic diseae.  Previous smoker.  Previous history of recreational drug use but very distant.  Single parent with socioeconomic stress.  On synthroid with TSH normal per primary.    ROS: Denies fever, malais, weight loss, blurry vision, decreased visual acuity, cough, sputum, SOB, hemoptysis, pleuritic pain, palpitaitons, heartburn, abdominal pain, melena, lower extremity edema, claudication, or rash.   General: Affect appropriate Healthy:  appears stated age HEENT: normal Neck supple with no adenopathy JVP normal no bruits no thyromegaly Lungs clear with no wheezing and good diaphragmatic motion Heart:  S1/S2 no murmur,rub, gallop or click PMI normal Abdomen: benighn, BS positve, no tenderness, no AAA no bruit.  No HSM or HJR Distal pulses intact with no bruits No edema Neuro non-focal Skin warm and dry No muscular weakness  Medications Current Outpatient Prescriptions  Medication Sig Dispense Refill  . ALPRAZolam (XANAX) 0.5 MG tablet Take 0.5 mg by mouth at bedtime as needed. See 02/14/11 note before rxing.      . fluocinonide (LIDEX) 0.05 % cream Apply topically 2 (two) times daily.        Marland Kitchen ibuprofen (ADVIL,MOTRIN) 200 MG tablet Take 200 mg by mouth daily as needed.        Marland Kitchen levothyroxine (SYNTHROID, LEVOTHROID) 75 MCG tablet Take 75 mcg by mouth daily.        . valACYclovir (VALTREX) 500 MG tablet Take 4 tablets by mouth two times a day for one day at the sign of outbreak of cold sore.         Allergies Bupropion hcl; Trazodone hcl; and Venlafaxine  Family History: Family History  Problem Relation Age of Onset  . Heart disease Mother   . Hyperlipidemia Mother   . Hypertension Mother   . Kidney disease Mother    stones  . Diabetes Father   . Cancer Neg Hx     No colon, breast or ovarian CA    Social History: History   Social History  . Marital Status: Single    Spouse Name: N/A    Number of Children: 1  . Years of Education: N/A   Occupational History  . TECH   . Receptionist at [employer]   . 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 Smoking status: Current Everyday Smoker -- 1.0 packs/day    Types: Cigarettes  . Smokeless tobacco: Not on file  . Alcohol Use: Yes     Rare  . Drug Use: No  . Sexually Active: Not on file   Other Topics Concern  . Not on file   Social History Narrative  . No narrative on file    Electrocardiogram:  NSR 72 normal ECG  Assessment and Plan

## 2011-03-01 NOTE — Assessment & Plan Note (Signed)
Atypical with associated dyspnea and palpitations.  F/U stress echo.  Exam and ECG normal

## 2011-03-01 NOTE — Assessment & Plan Note (Signed)
Continue as needed Xanax  Discuss with primary Prosac or Cymbalta.  Likely source of symptoms

## 2011-03-12 ENCOUNTER — Ambulatory Visit (HOSPITAL_COMMUNITY): Payer: Self-pay | Attending: Cardiovascular Disease | Admitting: Radiology

## 2011-03-12 DIAGNOSIS — R0609 Other forms of dyspnea: Secondary | ICD-10-CM | POA: Insufficient documentation

## 2011-03-12 DIAGNOSIS — R0789 Other chest pain: Secondary | ICD-10-CM | POA: Insufficient documentation

## 2011-03-12 DIAGNOSIS — R072 Precordial pain: Secondary | ICD-10-CM

## 2011-03-12 DIAGNOSIS — R0600 Dyspnea, unspecified: Secondary | ICD-10-CM

## 2011-03-12 DIAGNOSIS — R0989 Other specified symptoms and signs involving the circulatory and respiratory systems: Secondary | ICD-10-CM

## 2011-04-11 ENCOUNTER — Ambulatory Visit (INDEPENDENT_AMBULATORY_CARE_PROVIDER_SITE_OTHER): Payer: Self-pay | Admitting: Family Medicine

## 2011-04-11 ENCOUNTER — Ambulatory Visit: Payer: Self-pay | Admitting: Family Medicine

## 2011-04-11 ENCOUNTER — Encounter: Payer: Self-pay | Admitting: Family Medicine

## 2011-04-11 DIAGNOSIS — R079 Chest pain, unspecified: Secondary | ICD-10-CM

## 2011-04-11 NOTE — Assessment & Plan Note (Signed)
She continues with symptoms.  I again advised psych/counseling follow up.  I think anxiety is the most likely cause.  She declined. "I talk to my friends."  I told her that in spite of this, she was continuing to have symptoms and needed to decide if it was worth changing her management of her recurrent/chronic symptoms.  I no charged the visit and offered support of referral, should she accept. I will await input from patient. No SI/HI.

## 2011-04-11 NOTE — Progress Notes (Signed)
Persistent feelings of presyncope w/o syncope. Neg cards w/u.  H/o functional abd pain with prev GI w/u.  H/o use of tob, etoh and rx pain meds.  Intolerant of mult antidepressants.  No SI/HI.  Asking for advice.  Still feeling poorly.  I had prev advised psych eval.    Meds, vitals, and allergies reviewed.   ROS: See HPI.  Otherwise, noncontributory.  nad but rarely looks me straight in the eye during the conversation today ncat rrr ctab Ext well perfused Affect is appropriate but she appears unhappy/unsatisfied at the offered dx of anxiety

## 2011-04-11 NOTE — Patient Instructions (Signed)
I would think about counseling or going to psychiatry.  I think that would help.  Take care.  No charge on visit.

## 2011-04-13 ENCOUNTER — Emergency Department (HOSPITAL_BASED_OUTPATIENT_CLINIC_OR_DEPARTMENT_OTHER)
Admission: EM | Admit: 2011-04-13 | Discharge: 2011-04-13 | Disposition: A | Discharge status: Home / Self Care | Payer: Self-pay | Attending: Emergency Medicine | Admitting: Emergency Medicine

## 2011-04-13 DIAGNOSIS — R55 Syncope and collapse: Secondary | ICD-10-CM | POA: Insufficient documentation

## 2011-04-13 DIAGNOSIS — R112 Nausea with vomiting, unspecified: Secondary | ICD-10-CM | POA: Insufficient documentation

## 2011-04-13 LAB — URINE MICROSCOPIC-ADD ON

## 2011-04-13 LAB — URINALYSIS, ROUTINE W REFLEX MICROSCOPIC
Glucose, UA: NEGATIVE mg/dL
Specific Gravity, Urine: 1.028 (ref 1.005–1.030)
pH: 6 (ref 5.0–8.0)

## 2011-04-13 LAB — PREGNANCY, URINE: Preg Test, Ur: NEGATIVE

## 2011-08-08 ENCOUNTER — Other Ambulatory Visit: Payer: Self-pay | Admitting: *Deleted

## 2011-08-08 MED ORDER — LEVOTHYROXINE SODIUM 75 MCG PO TABS: 75.0000 ug | ORAL_TABLET | Freq: Every day | ORAL | Status: DC

## 2011-10-15 ENCOUNTER — Encounter (HOSPITAL_COMMUNITY): Payer: Self-pay

## 2011-10-15 ENCOUNTER — Other Ambulatory Visit: Payer: Self-pay

## 2011-10-15 ENCOUNTER — Emergency Department (HOSPITAL_COMMUNITY)
Admission: EM | Admit: 2011-10-15 | Discharge: 2011-10-15 | Disposition: A | Discharge status: Home / Self Care | Payer: Self-pay | Attending: Emergency Medicine | Admitting: Emergency Medicine

## 2011-10-15 DIAGNOSIS — F341 Dysthymic disorder: Secondary | ICD-10-CM | POA: Insufficient documentation

## 2011-10-15 DIAGNOSIS — Z79899 Other long term (current) drug therapy: Secondary | ICD-10-CM | POA: Insufficient documentation

## 2011-10-15 DIAGNOSIS — R55 Syncope and collapse: Secondary | ICD-10-CM | POA: Insufficient documentation

## 2011-10-15 DIAGNOSIS — E039 Hypothyroidism, unspecified: Secondary | ICD-10-CM | POA: Insufficient documentation

## 2011-10-15 DIAGNOSIS — M542 Cervicalgia: Secondary | ICD-10-CM | POA: Insufficient documentation

## 2011-10-15 DIAGNOSIS — R42 Dizziness and giddiness: Secondary | ICD-10-CM | POA: Insufficient documentation

## 2011-10-15 NOTE — ED Provider Notes (Signed)
History     CSN: [id number] Arrival date & time: 10/15/2011  1:23 PM   First MD Initiated Contact with Patient 10/15/11 1704      Chief Complaint  Patient presents with  . Dizziness    (Consider location/radiation/quality/duration/timing/severity/associated sxs/prior treatment) HPI  Patient relates for the past year she's had episodes where she feels dizzy meaning that she feels like she's going to pass out. She states the feeling persists whether she sits stands or is laying down. She denies any spinning she denies headache nausea vomiting blurred vision chest pain. She states she feels like she has tunnel vision like her vision is smaller in scope. She also has a pressure in the left side of her neck. She denies palpitations or shortness of breath. She states it typically lasts several hours. She relates this episode started this morning. She took her morning medicine and within an hour she started having the symptoms. She states she's been on Adderall for the past month, however she had symptoms before she started Adderall. She relates she's been evaluated by Dr. Eden Emms, cardiology and has had a normal stress echo and that was in April. She also  had an evaluation with neurology and had an evaluation in October, who thought she was having inner ear problems.    Primary Care Dr Isabella Stalling or Freddy Jaksch [facility]  Past Medical History  Diagnosis Date  . Anxiety     H/O episodic Alprazolam use  . Anemia   . Depression     Intolerant of Effexor, Lexapro in the past with SE's; states does not want to take again  . Cholelithiasis 2011    S/P laproscopic cholecystectomy with normal Intra-Op cholangiogram  . Hypothyroidism     H/O Graves Disease; took meds and then became hypothyroid  . Eczema   . Fatigue     with h/o normal overnight pulse oximetry  . Nephrocalcinosis     with h/o normal PTH/Cr/urine calcium  . Abdominal pain     s/p EGD per GI 2011    Past Surgical History  Procedure Date    . Esophagogastroduodenoscopy 05/25/2010    and Endo [place] - normal esoph; mild gastritis; normal CBD; GB absent; normal; no biliary stones; bx to check for H. Pylori and celiac sprue  . Cholecystectomy 12/2009  . Cesarean section     Family History  Problem Relation Age of Onset  . Heart disease Mother   . Hyperlipidemia Mother   . Hypertension Mother   . Kidney disease Mother     stones  . Diabetes Father   . Cancer Neg Hx     No colon, breast or ovarian CA    History  Substance Use Topics  . Smoking status: Current Everyday Smoker -- 1.0 packs/day    Types: Cigarettes  . Smokeless tobacco: Not on file  . Alcohol Use: Yes     Rare   employed in a veterinarian hospital  OB History    Grav Para Term Preterm Abortions TAB SAB Ect Mult Living                  Review of Systems  All other systems reviewed and are negative.    Allergies  Bupropion hcl; Trazodone hcl; and Venlafaxine  Home Medications   Current Outpatient Rx  Name Route Sig Dispense Refill  . AMPHETAMINE-DEXTROAMPHETAMINE 10 MG PO TABS Oral Take 5 mg by mouth 2 (two) times daily.      Marland Kitchen HYDROCODONE-ACETAMINOPHEN 5-500 MG PO  TABS Oral Take 1 tablet by mouth at bedtime as needed. For pain     . IBUPROFEN 200 MG PO TABS Oral Take 400 mg by mouth daily as needed. For pain    . LEVOTHYROXINE SODIUM 88 MCG PO TABS Oral Take 88 mcg by mouth daily.        BP 130/98  Pulse 80  Temp(Src) 98.1 F (36.7 C) (Oral)  Resp 16  SpO2 99%  LMP 10/08/2011 Vital signs normal  Physical Exam  Nursing note and vitals reviewed. Constitutional: She is oriented to person, place, and time. She appears well-developed and well-nourished.  Non-toxic appearance. She does not appear ill. No distress.  HENT:  Head: Normocephalic and atraumatic.  Right Ear: External ear normal.  Left Ear: External ear normal.  Nose: Nose normal. No mucosal edema or rhinorrhea.  Mouth/Throat: Oropharynx is clear and moist and mucous membranes  are normal. No dental abscesses or uvula swelling.  Eyes: Conjunctivae and EOM are normal. Pupils are equal, round, and reactive to light.  Neck: Normal range of motion and full passive range of motion without pain. Neck supple.  Cardiovascular: Normal rate, regular rhythm and normal heart sounds.  Exam reveals no gallop and no friction rub.   No murmur heard. Pulmonary/Chest: Effort normal and breath sounds normal. No respiratory distress. She has no wheezes. She has no rhonchi. She has no rales. She exhibits no tenderness and no crepitus.  Abdominal: Normal appearance.  Musculoskeletal: Normal range of motion. She exhibits no edema and no tenderness.       Moves all extremities well.   Neurological: She is alert and oriented to person, place, and time. She has normal strength. No cranial nerve deficit.  Skin: Skin is warm, dry and intact. No rash noted. No erythema. No pallor.  Psychiatric: She has a normal mood and affect. Her speech is normal and behavior is normal. Her mood appears not anxious.    ED Course  Procedures (including critical care time)  Orthostatic vital signs were normal. She relates her symptoms are starting to fade. I was going to recommend neurology or cardiology followup however patient has already had that done. She is an appointment with her primary care doctor in 3 days and she is advised to keep her appointment and discuss the symptoms further.   Date: 10/15/2011  Rate: 69  Rhythm: normal sinus rhythm  QRS Axis: normal  Intervals: normal  ST/T Wave abnormalities: normal  Conduction Disutrbances:none  Narrative Interpretation:   Old EKG Reviewed: unchanged from 04/01/2001    1. Near syncope     Plan discharge to keep her appt with her doctor in 3 days.  Devoria Albe, MD, FACEP   MDM          Ward Givens, MD 10/15/11 [phone]

## 2011-10-15 NOTE — ED Notes (Signed)
Patient presents with dizziness intermittently since 1200 today, approx 1 hour after taking her Adderall which is a new prescription.  Patient also reporting neck pressure, but denies pain, SOB, n/v.

## 2011-10-15 NOTE — ED Notes (Signed)
Patient presents with dizziness intermittently today with weakness. Patient denies chest pain, SOB, n/v. Resting quietly, ambulatory in department.

## 2011-11-06 HISTORY — PX: VAGINAL HYSTERECTOMY: SHX2639

## 2011-12-20 DIAGNOSIS — L209 Atopic dermatitis, unspecified: Secondary | ICD-10-CM | POA: Insufficient documentation

## 2012-03-04 ENCOUNTER — Ambulatory Visit (INDEPENDENT_AMBULATORY_CARE_PROVIDER_SITE_OTHER): Payer: Self-pay | Admitting: Obstetrics and Gynecology

## 2012-03-04 ENCOUNTER — Encounter: Payer: Self-pay | Admitting: Obstetrics and Gynecology

## 2012-03-04 VITALS — BP 100/68 | Resp 14 | Ht 62.5 in | Wt 153.0 lb

## 2012-03-04 DIAGNOSIS — M545 Low back pain: Secondary | ICD-10-CM

## 2012-03-04 DIAGNOSIS — Z124 Encounter for screening for malignant neoplasm of cervix: Secondary | ICD-10-CM

## 2012-03-04 DIAGNOSIS — N920 Excessive and frequent menstruation with regular cycle: Secondary | ICD-10-CM

## 2012-03-04 DIAGNOSIS — Z01419 Encounter for gynecological examination (general) (routine) without abnormal findings: Secondary | ICD-10-CM

## 2012-03-04 DIAGNOSIS — E039 Hypothyroidism, unspecified: Secondary | ICD-10-CM

## 2012-03-04 DIAGNOSIS — Z139 Encounter for screening, unspecified: Secondary | ICD-10-CM

## 2012-03-04 LAB — POCT URINALYSIS DIPSTICK
Bilirubin, UA: NEGATIVE
Glucose, UA: NEGATIVE
pH, UA: 7

## 2012-03-04 LAB — CBC
Hemoglobin: 13.2 g/dL (ref 12.0–15.0)
MCH: 29.2 pg (ref 26.0–34.0)
RBC: 4.52 MIL/uL (ref 3.87–5.11)
WBC: 9.6 10*3/uL (ref 4.0–10.5)

## 2012-03-04 LAB — T4, FREE: Free T4: 1.39 ng/dL (ref 0.80–1.80)

## 2012-03-04 LAB — PROLACTIN: Prolactin: 7.5 ng/mL

## 2012-03-04 NOTE — Progress Notes (Signed)
  Contraception condoms, not interested in other means Last pap 2009 wnl Last Mammo no Last Colonoscopy no Last Dexa Scan no Primary MD Dr. Isabella Stalling Abuse at Home no  C/o prolonged and painful menses  Physical Examination: General appearance - alert, well appearing, and in no distress Neck - supple, no significant adenopathy Chest - clear to auscultation, no wheezes, rales or rhonchi, symmetric air entry Heart - normal rate and regular rhythm Abdomen - soft, nontender, nondistended, no masses or organomegaly Breasts - breasts appear normal, no suspicious masses, no skin or nipple changes or axillary nodes Pelvic - normal external genitalia, vulva, vagina, cervix, uterus and adnexa  A/P H/o hypothyroidism on synthroid Pap sched u/s Labs Reviewed options (mirena, hormones, ablation...) RTO after u/s

## 2012-03-04 NOTE — Patient Instructions (Signed)
Please give pamphlets on ablation and mirena

## 2012-03-06 LAB — VITAMIN D 1,25 DIHYDROXY
Vitamin D2 1, 25 (OH)2: <8 pg/mL
Vitamin D3 1, 25 (OH)2: 78 pg/mL

## 2012-03-20 ENCOUNTER — Other Ambulatory Visit: Payer: Self-pay

## 2012-03-20 ENCOUNTER — Encounter: Payer: Self-pay | Admitting: Obstetrics and Gynecology

## 2012-05-11 ENCOUNTER — Emergency Department: Payer: Self-pay | Admitting: *Deleted

## 2012-07-19 ENCOUNTER — Encounter (HOSPITAL_COMMUNITY): Payer: Self-pay | Admitting: *Deleted

## 2012-07-19 ENCOUNTER — Inpatient Hospital Stay (HOSPITAL_COMMUNITY)
Admission: RE | Admit: 2012-07-19 | Discharge: 2012-07-22 | DRG: 897 | Disposition: A | Discharge status: Home / Self Care | Payer: Medicaid Other | Source: Ambulatory Visit | Attending: Psychiatry | Admitting: Psychiatry

## 2012-07-19 ENCOUNTER — Emergency Department (HOSPITAL_COMMUNITY)
Admission: EM | Admit: 2012-07-19 | Discharge: 2012-07-19 | Disposition: A | Discharge status: Rehabilitation Facility | Payer: Medicaid Other | Source: Home / Self Care | Attending: Emergency Medicine | Admitting: Emergency Medicine

## 2012-07-19 ENCOUNTER — Encounter (HOSPITAL_COMMUNITY): Payer: Self-pay | Admitting: Emergency Medicine

## 2012-07-19 DIAGNOSIS — Z560 Unemployment, unspecified: Secondary | ICD-10-CM

## 2012-07-19 DIAGNOSIS — F329 Major depressive disorder, single episode, unspecified: Secondary | ICD-10-CM | POA: Diagnosis present

## 2012-07-19 DIAGNOSIS — L259 Unspecified contact dermatitis, unspecified cause: Secondary | ICD-10-CM | POA: Diagnosis present

## 2012-07-19 DIAGNOSIS — F1310 Sedative, hypnotic or anxiolytic abuse, uncomplicated: Secondary | ICD-10-CM | POA: Insufficient documentation

## 2012-07-19 DIAGNOSIS — Z841 Family history of disorders of kidney and ureter: Secondary | ICD-10-CM | POA: Insufficient documentation

## 2012-07-19 DIAGNOSIS — F19939 Other psychoactive substance use, unspecified with withdrawal, unspecified: Principal | ICD-10-CM | POA: Diagnosis present

## 2012-07-19 DIAGNOSIS — F411 Generalized anxiety disorder: Secondary | ICD-10-CM | POA: Insufficient documentation

## 2012-07-19 DIAGNOSIS — Z888 Allergy status to other drugs, medicaments and biological substances status: Secondary | ICD-10-CM

## 2012-07-19 DIAGNOSIS — F3289 Other specified depressive episodes: Secondary | ICD-10-CM | POA: Insufficient documentation

## 2012-07-19 DIAGNOSIS — F17200 Nicotine dependence, unspecified, uncomplicated: Secondary | ICD-10-CM | POA: Insufficient documentation

## 2012-07-19 DIAGNOSIS — Z833 Family history of diabetes mellitus: Secondary | ICD-10-CM | POA: Insufficient documentation

## 2012-07-19 DIAGNOSIS — Z79899 Other long term (current) drug therapy: Secondary | ICD-10-CM

## 2012-07-19 DIAGNOSIS — F1110 Opioid abuse, uncomplicated: Secondary | ICD-10-CM

## 2012-07-19 DIAGNOSIS — E039 Hypothyroidism, unspecified: Secondary | ICD-10-CM | POA: Diagnosis present

## 2012-07-19 DIAGNOSIS — Z8249 Family history of ischemic heart disease and other diseases of the circulatory system: Secondary | ICD-10-CM | POA: Insufficient documentation

## 2012-07-19 DIAGNOSIS — F1120 Opioid dependence, uncomplicated: Secondary | ICD-10-CM

## 2012-07-19 DIAGNOSIS — Z809 Family history of malignant neoplasm, unspecified: Secondary | ICD-10-CM | POA: Insufficient documentation

## 2012-07-19 LAB — RAPID URINE DRUG SCREEN, HOSP PERFORMED
Amphetamines: NOT DETECTED
Barbiturates: NOT DETECTED
Benzodiazepines: NOT DETECTED
Cocaine: NOT DETECTED
Tetrahydrocannabinol: NOT DETECTED

## 2012-07-19 LAB — CBC
HCT: 38.8 % (ref 36.0–46.0)
Hemoglobin: 13.3 g/dL (ref 12.0–15.0)
MCHC: 34.3 g/dL (ref 30.0–36.0)
RDW: 13.5 % (ref 11.5–15.5)
WBC: 10.7 10*3/uL — ABNORMAL HIGH (ref 4.0–10.5)

## 2012-07-19 LAB — COMPREHENSIVE METABOLIC PANEL
ALT: 27 U/L (ref 0–35)
AST: 22 U/L (ref 0–37)
Albumin: 4.3 g/dL (ref 3.5–5.2)
Alkaline Phosphatase: 62 U/L (ref 39–117)
BUN: 11 mg/dL (ref 6–23)
Chloride: 102 mEq/L (ref 96–112)
Potassium: 3.4 mEq/L — ABNORMAL LOW (ref 3.5–5.1)
Sodium: 141 mEq/L (ref 135–145)
Total Bilirubin: 0.2 mg/dL — ABNORMAL LOW (ref 0.3–1.2)
Total Protein: 7.4 g/dL (ref 6.0–8.3)

## 2012-07-19 LAB — ACETAMINOPHEN LEVEL: Acetaminophen (Tylenol), Serum: <15 ug/mL (ref 10–30)

## 2012-07-19 LAB — POCT PREGNANCY, URINE: Preg Test, Ur: NEGATIVE

## 2012-07-19 MED ORDER — CLONIDINE HCL 0.1 MG PO TABS
0.1000 mg | ORAL_TABLET | ORAL | Status: DC
  Administered 2012-07-21: 0.1 mg via ORAL
  Filled 2012-07-19 (×4): qty 1

## 2012-07-19 MED ORDER — POTASSIUM CHLORIDE CRYS ER 20 MEQ PO TBCR
40.0000 meq | EXTENDED_RELEASE_TABLET | Freq: Once | ORAL | Status: AC
  Administered 2012-07-19: 40 meq via ORAL
  Filled 2012-07-19 (×2): qty 2

## 2012-07-19 MED ORDER — NICOTINE 21 MG/24HR TD PT24: 21.0000 mg | MEDICATED_PATCH | Freq: Every day | TRANSDERMAL | Status: DC

## 2012-07-19 MED ORDER — CLONIDINE HCL 0.1 MG PO TABS: 0.1000 mg | ORAL_TABLET | Freq: Every day | ORAL | Status: DC

## 2012-07-19 MED ORDER — IBUPROFEN 400 MG PO TABS: 600.0000 mg | ORAL_TABLET | Freq: Three times a day (TID) | ORAL | Status: DC | PRN

## 2012-07-19 MED ORDER — ACETAMINOPHEN 325 MG PO TABS: 650.0000 mg | ORAL_TABLET | Freq: Four times a day (QID) | ORAL | Status: DC | PRN

## 2012-07-19 MED ORDER — ONDANSETRON HCL 4 MG PO TABS: 4.0000 mg | ORAL_TABLET | Freq: Three times a day (TID) | ORAL | Status: DC | PRN

## 2012-07-19 MED ORDER — ONDANSETRON HCL 8 MG PO TABS: 4.0000 mg | ORAL_TABLET | Freq: Three times a day (TID) | ORAL | Status: DC | PRN

## 2012-07-19 MED ORDER — HYDROXYZINE HCL 50 MG PO TABS
50.0000 mg | ORAL_TABLET | Freq: Every evening | ORAL | Status: DC | PRN
  Administered 2012-07-19 – 2012-07-22 (×5): 50 mg via ORAL
  Filled 2012-07-19 (×9): qty 1

## 2012-07-19 MED ORDER — ONDANSETRON 4 MG PO TBDP: 4.0000 mg | ORAL_TABLET | Freq: Four times a day (QID) | ORAL | Status: DC | PRN

## 2012-07-19 MED ORDER — ACETAMINOPHEN 325 MG PO TABS: 650.0000 mg | ORAL_TABLET | ORAL | Status: DC | PRN

## 2012-07-19 MED ORDER — IBUPROFEN 600 MG PO TABS: 600.0000 mg | ORAL_TABLET | Freq: Three times a day (TID) | ORAL | Status: DC | PRN

## 2012-07-19 MED ORDER — LEVOTHYROXINE SODIUM 88 MCG PO TABS
88.0000 ug | ORAL_TABLET | Freq: Every day | ORAL | Status: DC
  Administered 2012-07-19: 88 ug via ORAL
  Filled 2012-07-19 (×2): qty 1

## 2012-07-19 MED ORDER — HYDROXYZINE HCL 25 MG PO TABS
25.0000 mg | ORAL_TABLET | Freq: Four times a day (QID) | ORAL | Status: DC | PRN
  Administered 2012-07-21: 25 mg via ORAL
  Filled 2012-07-19: qty 1

## 2012-07-19 MED ORDER — ZOLPIDEM TARTRATE 5 MG PO TABS: 5.0000 mg | ORAL_TABLET | Freq: Every evening | ORAL | Status: DC | PRN

## 2012-07-19 MED ORDER — MAGNESIUM HYDROXIDE 400 MG/5ML PO SUSP: 30.0000 mL | Freq: Every day | ORAL | Status: DC | PRN

## 2012-07-19 MED ORDER — CLONIDINE HCL 0.1 MG PO TABS
0.1000 mg | ORAL_TABLET | Freq: Four times a day (QID) | ORAL | Status: AC
  Administered 2012-07-19 – 2012-07-21 (×5): 0.1 mg via ORAL
  Filled 2012-07-19 (×10): qty 1

## 2012-07-19 MED ORDER — NICOTINE 21 MG/24HR TD PT24
21.0000 mg | MEDICATED_PATCH | Freq: Every day | TRANSDERMAL | Status: DC
  Filled 2012-07-19 (×2): qty 1

## 2012-07-19 MED ORDER — AMPHETAMINE-DEXTROAMPHETAMINE 10 MG PO TABS: 5.0000 mg | ORAL_TABLET | Freq: Two times a day (BID) | ORAL | Status: DC

## 2012-07-19 MED ORDER — METHOCARBAMOL 500 MG PO TABS
500.0000 mg | ORAL_TABLET | Freq: Three times a day (TID) | ORAL | Status: DC | PRN
  Administered 2012-07-19 – 2012-07-22 (×5): 500 mg via ORAL
  Filled 2012-07-19 (×5): qty 1

## 2012-07-19 MED ORDER — NAPROXEN 500 MG PO TABS
500.0000 mg | ORAL_TABLET | Freq: Two times a day (BID) | ORAL | Status: DC | PRN
  Filled 2012-07-19: qty 1

## 2012-07-19 MED ORDER — POTASSIUM CHLORIDE CRYS ER 20 MEQ PO TBCR
40.0000 meq | EXTENDED_RELEASE_TABLET | Freq: Once | ORAL | Status: DC
  Administered 2012-07-19: 40 meq via ORAL
  Filled 2012-07-19: qty 2

## 2012-07-19 MED ORDER — LOPERAMIDE HCL 2 MG PO CAPS: 2.0000 mg | ORAL_CAPSULE | ORAL | Status: DC | PRN

## 2012-07-19 MED ORDER — DICYCLOMINE HCL 20 MG PO TABS
20.0000 mg | ORAL_TABLET | Freq: Four times a day (QID) | ORAL | Status: DC | PRN
  Administered 2012-07-21 (×2): 20 mg via ORAL
  Filled 2012-07-19 (×2): qty 1

## 2012-07-19 MED ORDER — ALUM & MAG HYDROXIDE-SIMETH 200-200-20 MG/5ML PO SUSP: 30.0000 mL | ORAL | Status: DC | PRN

## 2012-07-19 MED ORDER — LEVOTHYROXINE SODIUM 88 MCG PO TABS
88.0000 ug | ORAL_TABLET | Freq: Every day | ORAL | Status: DC
  Administered 2012-07-20 – 2012-07-22 (×3): 88 ug via ORAL
  Filled 2012-07-19 (×5): qty 1

## 2012-07-19 MED ORDER — NICOTINE 21 MG/24HR TD PT24
21.0000 mg | MEDICATED_PATCH | Freq: Every day | TRANSDERMAL | Status: DC
  Administered 2012-07-19 – 2012-07-22 (×4): 21 mg via TRANSDERMAL
  Filled 2012-07-19 (×5): qty 1

## 2012-07-19 NOTE — ED Notes (Signed)
Pt does report SI thoughts-with no plan

## 2012-07-19 NOTE — ED Provider Notes (Signed)
Medical screening examination/treatment/procedure(s) were performed by non-physician practitioner and as supervising physician I was immediately available for consultation/collaboration.    Vida Roller, MD 07/19/12 [phone]

## 2012-07-19 NOTE — BH Assessment (Signed)
[facility] Assessment Progress Note   Pt accepted to [facility] by Verne Spurr, PA to the service of [employer].  Assigned to Rm 305-1.  Called Antonietta Breach, Assessment Counselor, to notify her at 10:35.  Doylene Canning, MA, Assessment Counselor 07/19/2012 @ 10:42

## 2012-07-19 NOTE — ED Notes (Signed)
Pt wanded by security. 

## 2012-07-19 NOTE — H&P (Signed)
Psychiatric Admission Assessment Adult  Patient Identification:  Katie Little   35yoSWF Date of Evaluation:  07/19/2012 Chief Complaint:  OPIOID DEPENDENCE DEPRESSIVE D/O from addiction   UDS+opiates  History of Present Illness: Presented to ED requesting detox from Vicodin which she started taking for abdominal pain 2-3 years ago. Over the past 5-6 months she has been taking 30-60 mg daily. Her home went into foreclosure in May she is unemployed and 34 yo son is with her parents.She has fleeting SI no attempts gestures or plan.She could not contract for safety and hence admission was requested.    Past Psychiatric History: Diagnosis:OPIOID Dependence   Hospitalizations:none   Outpatient Care:none   Substance Abuse Care:none  Self-Mutilation:none   Suicidal Attempts:none   Violent Behaviors:none   Past Medical History:   Past Medical History  Diagnosis Date  . Anxiety     H/O episodic Alprazolam use  . Anemia   . Depression     Intolerant of Effexor, Lexapro in the past with SE's; states does not want to take again  . Cholelithiasis 2011    S/P laproscopic cholecystectomy with normal Intra-Op cholangiogram  . Hypothyroidism     H/O Graves Disease; took meds and then became hypothyroid  . Eczema   . Fatigue     with h/o normal overnight pulse oximetry  . Nephrocalcinosis     with h/o normal PTH/Cr/urine calcium  . Abdominal pain     s/p EGD per GI 2011   None. Allergies:   Allergies  Allergen Reactions  . Bupropion Hcl     REACTION: intolerant  . Ibuprofen     intolerant  . Trazodone Hcl     REACTION: intolerant  . Venlafaxine     REACTION: intolerant   PTA Medications: Prescriptions prior to admission  Medication Sig Dispense Refill  . ALPRAZolam (XANAX) 0.5 MG tablet Take 0.5 mg by mouth at bedtime.      Marland Kitchen amphetamine-dextroamphetamine (ADDERALL) 10 MG tablet Take 10 mg by mouth 2 (two) times daily.      Marland Kitchen HYDROcodone-acetaminophen (NORCO/VICODIN) 5-325 MG  per tablet Take 1 tablet by mouth every 6 (six) hours as needed.      . promethazine (PHENERGAN) 25 MG tablet Take 25 mg by mouth every 6 (six) hours as needed.      Marland Kitchen levothyroxine (SYNTHROID, LEVOTHROID) 88 MCG tablet Take 88 mcg by mouth daily.          Previous Psychotropic Medications:  Medication/Dose  Wellbutrin- Trazadone  Effexor-intolerant               Substance Abuse History in the last 12 months: Substance Age of 1st Use Last Use Amount Specific Type  Nicotine      Alcohol      Cannabis      Opiates 33 Today  30-60mg   Vicodin   Cocaine      Methamphetamines      LSD      Ecstasy      Benzodiazepines      Caffeine      Inhalants      Others:                         Consequences of Substance Abuse: Withdrawal Symptoms:   Cramps Diarrhea Nausea  Social History: Current Place of Residence:   Place of Birth:   Family Members: Marital Status:  Single Children:  Sons:one son age 44  Daughters: Relationships: Education:  College- some  Educational Problems/Performance: Religious Beliefs/Practices: History of Abuse (Emotional/Phsycial/Sexual) Occupational Experiences; Military History:  None. Legal History:in foreclosure  Hobbies/Interests:  Family History:   Family History  Problem Relation Age of Onset  . Heart disease Mother   . Hyperlipidemia Mother   . Hypertension Mother   . Kidney disease Mother     stones  . Diabetes Father   . Cancer Neg Hx     No colon, breast or ovarian CA    Mental Status Examination/Evaluation: Objective:  Appearance: Disheveled  Eye Contact::  Fair  Speech:  Clear and Coherent and Normal Rate  Volume:  Normal  Mood:  Anxious, Depressed and Irritable  Affect:  Constricted  Thought Process:  Intact  Orientation:  Full  Thought Content:no AVH/psychosis   Suicidal Thoughts:  No-has passive fleeting thoughts no plan   Homicidal Thoughts:  No  Memory:  Immediate;   Good  Judgement:  Intact  Insight:  Fair   Psychomotor Activity:  Normal  Concentration:  Good  Recall:  Good  Akathisia:  No  Handed:  Right  AIMS (if indicated):     Assets:  Communication Skills Desire for Improvement Resilience  Sleep:       Laboratory/X-Ray Psychological Evaluation(s)      Assessment:    AXIS I:  Opioid Dependance  AXIS II:  Deferred AXIS III:   Past Medical History  Diagnosis Date  . Anxiety     H/O episodic Alprazolam use  . Anemia   . Depression     Intolerant of Effexor, Lexapro in the past with SE's; states does not want to take again  . Cholelithiasis 2011    S/P laproscopic cholecystectomy with normal Intra-Op cholangiogram  . Hypothyroidism     H/O Graves Disease; took meds and then became hypothyroid  . Eczema   . Fatigue     with h/o normal overnight pulse oximetry  . Nephrocalcinosis     with h/o normal PTH/Cr/urine calcium  . Abdominal pain     s/p EGD per GI 2011   AXIS IV:  economic problems, educational problems, housing problems, occupational problems, other psychosocial or environmental problems and problems with primary support group AXIS V:  31-40 impairment in reality testing  Treatment Plan/Recommendations:  Treatment Plan Summary: Daily contact with patient to assess and evaluate symptoms and progress in treatment Medication management Current Medications:  Current Facility-Administered Medications  Medication Dose Route Frequency Provider Last Rate Last Dose  . acetaminophen (TYLENOL) tablet 650 mg  650 mg Oral Q6H PRN Verne Spurr, PA-C      . alum & mag hydroxide-simeth (MAALOX/MYLANTA) 200-200-20 MG/5ML suspension 30 mL  30 mL Oral Q4H PRN Verne Spurr, PA-C      . cloNIDine (CATAPRES) tablet 0.1 mg  0.1 mg Oral QID Verne Spurr, PA-C   0.1 mg at 07/19/12 1749   Followed by  . cloNIDine (CATAPRES) tablet 0.1 mg  0.1 mg Oral BH-qamhs Verne Spurr, PA-C       Followed by  . cloNIDine (CATAPRES) tablet 0.1 mg  0.1 mg Oral QAC breakfast Verne Spurr,  PA-C      . dicyclomine (BENTYL) tablet 20 mg  20 mg Oral Q6H PRN Verne Spurr, PA-C      . hydrOXYzine (ATARAX/VISTARIL) tablet 25 mg  25 mg Oral Q6H PRN Verne Spurr, PA-C      . ibuprofen (ADVIL,MOTRIN) tablet 600 mg  600 mg Oral Q8H PRN Verne Spurr, PA-C      .  levothyroxine (SYNTHROID, LEVOTHROID) tablet 88 mcg  88 mcg Oral Q0600 Verne Spurr, PA-C      . loperamide (IMODIUM) capsule 2-4 mg  2-4 mg Oral PRN Verne Spurr, PA-C      . magnesium hydroxide (MILK OF MAGNESIA) suspension 30 mL  30 mL Oral Daily PRN Verne Spurr, PA-C      . methocarbamol (ROBAXIN) tablet 500 mg  500 mg Oral Q8H PRN Verne Spurr, PA-C      . naproxen (NAPROSYN) tablet 500 mg  500 mg Oral BID PRN Verne Spurr, PA-C      . nicotine (NICODERM CQ - dosed in mg/24 hours) patch 21 mg  21 mg Transdermal Daily Verne Spurr, PA-C      . nicotine (NICODERM CQ - dosed in mg/24 hours) patch 21 mg  21 mg Transdermal Daily Alyson Kuroski-Mazzei, DO   21 mg at 07/19/12 1746  . ondansetron (ZOFRAN) tablet 4 mg  4 mg Oral Q8H PRN Verne Spurr, PA-C      . ondansetron (ZOFRAN-ODT) disintegrating tablet 4 mg  4 mg Oral Q6H PRN Verne Spurr, PA-C      . potassium chloride SA (K-DUR,KLOR-CON) CR tablet 40 mEq  40 mEq Oral Once [employer], PA-C   40 mEq at 07/19/12 1749  . DISCONTD: acetaminophen (TYLENOL) tablet 650 mg  650 mg Oral Q4H PRN Verne Spurr, PA-C      . DISCONTD: alum & mag hydroxide-simeth (MAALOX/MYLANTA) 200-200-20 MG/5ML suspension 30 mL  30 mL Oral PRN Verne Spurr, PA-C       Facility-Administered Medications Ordered in Other Encounters  Medication Dose Route Frequency Provider Last Rate Last Dose  . DISCONTD: acetaminophen (TYLENOL) tablet 650 mg  650 mg Oral Q4H PRN Angus Seller, PA      . DISCONTD: alum & mag hydroxide-simeth (MAALOX/MYLANTA) 200-200-20 MG/5ML suspension 30 mL  30 mL Oral PRN Angus Seller, PA      . DISCONTD: amphetamine-dextroamphetamine (ADDERALL) tablet 5 mg  5 mg Oral BID  Angus Seller, PA      . DISCONTD: ibuprofen (ADVIL,MOTRIN) tablet 600 mg  600 mg Oral Q8H PRN Angus Seller, PA      . DISCONTD: levothyroxine (SYNTHROID, LEVOTHROID) tablet 88 mcg  88 mcg Oral Q0600 Angus Seller, PA   88 mcg at 07/19/12 0703  . DISCONTD: nicotine (NICODERM CQ - dosed in mg/24 hours) patch 21 mg  21 mg Transdermal Daily Angus Seller, PA      . DISCONTD: ondansetron (ZOFRAN) tablet 4 mg  4 mg Oral Q8H PRN Angus Seller, PA      . DISCONTD: potassium chloride SA (K-DUR,KLOR-CON) CR tablet 40 mEq  40 mEq Oral Once Angus Seller, PA   40 mEq at 07/19/12 0404  . DISCONTD: zolpidem (AMBIEN) tablet 5 mg  5 mg Oral QHS PRN Angus Seller, PA        Observation Level/Precautions:  Detox  Laboratory:  TSH   Psychotherapy:    Medications:    Routine PRN Medications:  Yes  Consultations:    Discharge Concerns: no income and home in foreclosure    Other:     Aleia Larocca,MICKIE D. 9/14/20137:03 PM

## 2012-07-19 NOTE — ED Notes (Signed)
Report given to [facility] at [facility].  Security called to transport pt.

## 2012-07-19 NOTE — BHH Counselor (Signed)
Pt. Notified of acceptance to [facility].  Pt. Reviewed and signed admission forms.  Pt. Accepted by Neil-K.Mazzi, 305-1.

## 2012-07-19 NOTE — Progress Notes (Signed)
Patient ID: Kenton Kingfisher, female   DOB: 08-06-1976, 36 y.o.   MRN: [id number] 07-19-12 @ 1841 nursing shift note; D: pt was extremely tired and stated she had very little sleep. She is at b[facility] for opioid abuse/dependence. A: she stated she presently was not having any w/d symptoms and moderate pain.  She refused any pain medication. R: pt just wanted to go to bed and she did so. rn will continue to monitor and q 15 min checks continue.

## 2012-07-19 NOTE — ED Notes (Signed)
No open rooms in POD C.

## 2012-07-19 NOTE — ED Notes (Signed)
No sitters available at this time.  House coverage aware.  Pt A & O x 4 in NAD.  Pt is watching tv.  Will monitor closely.

## 2012-07-19 NOTE — ED Notes (Signed)
Pt transported to [facility] with security and tech.  One black back pack with pt.;

## 2012-07-19 NOTE — ED Notes (Signed)
Notified charge RN and [facility] about pt; pt placed in blue scrubs and security paged to wand pt

## 2012-07-19 NOTE — Tx Team (Signed)
Initial Interdisciplinary Treatment Plan  PATIENT STRENGTHS: (choose at least two) Ability for insight Average or above average intelligence Motivation for treatment/growth  PATIENT STRESSORS: Substance abuse   PROBLEM LIST: Problem List/Patient Goals Date to be addressed Date deferred Reason deferred Estimated date of resolution  depression 07-19-12     Anxiety  07-19-12     Substance abuse/dependence 07-19-12                                          DISCHARGE CRITERIA:  Improved stabilization in mood, thinking, and/or behavior Reduction of life-threatening or endangering symptoms to within safe limits Withdrawal symptoms are absent or subacute and managed without 24-hour nursing intervention  PRELIMINARY DISCHARGE PLAN: Attend aftercare/continuing care group  PATIENT/FAMIILY INVOLVEMENT: This treatment plan has been presented to and reviewed with the patient, Katie Little, and/or family member, .  The patient and family have been given the opportunity to ask questions and make suggestions.  Valente David 07/19/2012, 6:09 PM

## 2012-07-19 NOTE — ED Notes (Signed)
No sitters available.  House coverage aware.

## 2012-07-19 NOTE — ED Notes (Signed)
Pt wants detox from opiates, has been on opiates for a year, taking 30-70mg  of hydrocodone per day, last used today

## 2012-07-19 NOTE — Progress Notes (Signed)
Pt admitted voluntary requesting help with detox from opiates.Pt taking 30-60mg  daily and buying off the streets. Stressors include financial stressors and home being in foreclosure. Pt lives with her 34 yr old son. She lists friends as her support system and she reports that her parents are "somewhat supportive." Pt's son is staying with his father's parents while pt is in the hospital. She denies si and hi on admission. She denies auditory hallucinations. Pt has a hx of  hypothyroidism and anxiety.

## 2012-07-19 NOTE — ED Notes (Signed)
Meal tray ordered 

## 2012-07-19 NOTE — BH Assessment (Signed)
Assessment Note   Katie Little is an 36 y.o. female. Pt presents to [facility] with symptoms of  depression & anxiety. She has multiple stressors starting with the loss of her May 2013, home in foreclosure, financial problems, etc. Patient has passive suicidal thoughts with no plan. When asked if she is suicidal patient has difficulty answering question and appears very indecisive with responding "yes" or "no". Patient will not contract for safety. She explains that her suicidal thoughts come and go but right now she is scared of what she may do to herself. No HI. No AVH's. 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 Patient states that she started taking Vicodin for pain 2-3 yrs ago. However, over the past yr she's been buying  prescription narcotics off the street.. She began using heavy over the past 5-6 months taking 30-60mg s daily. Her last use was prior to arrival and patient consumed 50-60 mgs of pills. Pt reports current withdrawal: [place], mild diarrhea, muscle spasms, etc.  Patient has never been through any addiction treatment in the past nor has a history of hospitalizations for mental illness.   Pt referred to [facility] for inpatient treatment. Pt is pending acceptance.   Axis I:Opioid Dependence, Depressive Disorder Nos Axis II: Deferred Axis III:  Past Medical History  Diagnosis Date  . Anxiety     H/O episodic Alprazolam use  . Anemia   . Depression     Intolerant of Effexor, Lexapro in the past with SE's; states does not want to take again  . Cholelithiasis 2011    S/P laproscopic cholecystectomy with normal Intra-Op cholangiogram  . Hypothyroidism     H/O Graves Disease; took meds and then became hypothyroid  . Eczema   . Fatigue     with h/o normal overnight pulse oximetry  . Nephrocalcinosis     with h/o normal PTH/Cr/urine calcium  . Abdominal pain     s/p EGD per GI 2011   Axis IV: economic problems, other psychosocial or environmental problems and problems  related to social environment Axis V: 31-40 impairment in reality testing  Past Medical History:  Past Medical History  Diagnosis Date  . Anxiety     H/O episodic Alprazolam use  . Anemia   . Depression     Intolerant of Effexor, Lexapro in the past with SE's; states does not want to take again  . Cholelithiasis 2011    S/P laproscopic cholecystectomy with normal Intra-Op cholangiogram  . Hypothyroidism     H/O Graves Disease; took meds and then became hypothyroid  . Eczema   . Fatigue     with h/o normal overnight pulse oximetry  . Nephrocalcinosis     with h/o normal PTH/Cr/urine calcium  . Abdominal pain     s/p EGD per GI 2011    Past Surgical History  Procedure Date  . Esophagogastroduodenoscopy 05/25/2010    and Endo [place] - normal esoph; mild gastritis; normal CBD; GB absent; normal; no biliary stones; bx to check for H. Pylori and celiac sprue  . Cholecystectomy 12/2009  . Cesarean section     Family History:  Family History  Problem Relation Age of Onset  . Heart disease Mother   . Hyperlipidemia Mother   . Hypertension Mother   . Kidney disease Mother     stones  . Diabetes Father   . Cancer Neg Hx     No colon, breast or ovarian CA    Social History:  reports that  she has been smoking Cigarettes.  She has been smoking about 1 pack per day. She does not have any smokeless tobacco history on file. She reports that she drinks alcohol. She reports that she uses illicit drugs.  Additional Social History:  Alcohol / Drug Use Pain Medications: Vicodin Prescriptions: See MAR Over the Counter: See MAR Substance #1 Name of Substance 1: Opiates-Vicodin 1 - Age of First Use: 35 yrs old  1 - Amount (size/oz): 30-60mg s per day 1 - Frequency: 5-6 months 1 - Duration: daily for the past 5-6 months 1 - Last Use / Amount: I took 50-60mg s right before I walked in the ED today/07/19/2012:   CIWA: CIWA-Ar BP: 132/84 mmHg Pulse Rate: 105  COWS:    Allergies:  Allergies   Allergen Reactions  . Bupropion Hcl     REACTION: intolerant  . Trazodone Hcl     REACTION: intolerant  . Venlafaxine     REACTION: intolerant    Home Medications:  (Not in a hospital admission)  OB/GYN Status:  Patient's last menstrual period was 07/05/2012.  General Assessment Data Location of Assessment: [facility] ED ACT Assessment: Yes Living Arrangements: Other (Comment);Children (lives alone in house with 37 yr old son) Can pt return to current living arrangement?: Yes Admission Status: Voluntary Is patient capable of signing voluntary admission?: Yes Transfer from: Acute Hospital Referral Source: Self/Family/Friend     Risk to self Suicidal Ideation: Yes-Currently Present Suicidal Intent: Yes-Currently Present Is patient at risk for suicide?: Yes Suicidal Plan?: No Access to Means: No What has been your use of drugs/alcohol within the last 12 months?:  (Vicodin) Previous Attempts/Gestures: No How many times?:  (0) Other Self Harm Risks:  (0) Triggers for Past Attempts:  (no previous attempts to harm self) Intentional Self Injurious Behavior: None Family Suicide History: Yes (mother-depression; maternal Grandfather- alcoholism) Recent stressful life event(s): Other (Comment);Financial Problems;Job Loss (loss job May 2013 and  home in foreclosure) Persecutory voices/beliefs?: No Depression: Yes Depression Symptoms: Tearfulness;Isolating;Fatigue;Loss of interest in usual pleasures;Feeling worthless/self pity;Feeling angry/irritable;Insomnia Substance abuse history and/or treatment for substance abuse?: No Suicide prevention information given to non-admitted patients: Not applicable  Risk to Others Homicidal Ideation: No Thoughts of Harm to Others: No Current Homicidal Intent: No Current Homicidal Plan: No Access to Homicidal Means: No Identified Victim:  (n/a) History of harm to others?: No Assessment of Violence: None Noted Violent Behavior Description:  (pt  calm and cooperative during the assessment-tearful) Does patient have access to weapons?: No Criminal Charges Pending?: No Does patient have a court date: No  Psychosis Hallucinations: None noted Delusions: None noted  Mental Status Report Appear/Hygiene: Disheveled Eye Contact: Fair Motor Activity: Freedom of movement Speech: Logical/coherent Level of Consciousness: Alert Mood: Depressed;Sad Affect: Depressed Anxiety Level: Minimal Thought Processes: Coherent Judgement: Impaired Orientation: Person;Place;Time;Situation Obsessive Compulsive Thoughts/Behaviors: None  Cognitive Functioning Concentration: Decreased Memory: Recent Intact;Remote Intact IQ: Average Insight: Poor Impulse Control: Fair Appetite: Poor Weight Loss:  (no wt. loss reported) Weight Gain:  (no wt gain reported) Sleep: Decreased Total Hours of Sleep:  (3-4 hrs per night) Vegetative Symptoms: None  ADLScreening [facility]) Patient's cognitive ability adequate to safely complete daily activities?: Yes Patient able to express need for assistance with ADLs?: Yes Independently performs ADLs?: Yes (appropriate for developmental age)  Abuse/Neglect [facility]) Physical Abuse: Denies Verbal Abuse: Denies Sexual Abuse: Denies  Prior Inpatient Therapy Prior Inpatient Therapy: No Prior Therapy Dates:  (n/a) Prior Therapy Facilty/Provider(s):  (n/a) Reason for Treatment:  (n/a)  Prior Outpatient Therapy Prior Outpatient Therapy: No Prior Therapy Dates:  (n/a) Prior Therapy Facilty/Provider(s):  (n/a) Reason for Treatment:  (n/a)  ADL Screening (condition at time of admission) Patient's cognitive ability adequate to safely complete daily activities?: Yes Patient able to express need for assistance with ADLs?: Yes Independently performs ADLs?: Yes (appropriate for developmental age) Weakness of Legs: None Weakness of Arms/Hands: None  Home Assistive Devices/Equipment Home Assistive  Devices/Equipment: None    Abuse/Neglect Assessment (Assessment to be complete while patient is alone) Physical Abuse: Denies Verbal Abuse: Denies Sexual Abuse: Denies Exploitation of patient/patient's resources: Denies Self-Neglect: Denies Values / Beliefs Cultural Requests During Hospitalization: None Spiritual Requests During Hospitalization: None   Advance Directives (For Healthcare) Advance Directive: Patient does not have advance directive Nutrition Screen- [facility] Adult/WL/AP Patient's home diet: Regular  Additional Information 1:1 In Past 12 Months?: No CIRT Risk: No Elopement Risk: No Does patient have medical clearance?: Yes     Disposition:  Disposition Disposition of Patient: Inpatient treatment program;Referred to [facility])  On Site Evaluation by:   Reviewed with Physician:     Melynda Ripple [facility] 07/19/2012 6:43 AM

## 2012-07-19 NOTE — ED Provider Notes (Signed)
History     CSN: [id number]  Arrival date & time 07/19/12  [id number]   First MD Initiated Contact with Patient 07/19/12 [phone]      Chief Complaint  Patient presents with  . Medical Clearance  . Suicidal   HPI  History provided by the patient. Patient is a 36 year old female with history of depression, anxiety, hypothyroidism who presents with requests for help with narcotics abuse. Patient states she's been abusing Street prescription narcotics for the past year or more. She is beginning to use heavily over the past 5 months almost daily. Patient states she is really feeling depressed from her addiction to the point that she feels she needs help. Patient denies any current SI or HI. She denies any other significant complaints at this time. She denies any chest pain, abdominal pain, nausea, vomiting, diarrhea constipation. Patient has never been through any addiction treatment in the past.    Past Medical History  Diagnosis Date  . Anxiety     H/O episodic Alprazolam use  . Anemia   . Depression     Intolerant of Effexor, Lexapro in the past with SE's; states does not want to take again  . Cholelithiasis 2011    S/P laproscopic cholecystectomy with normal Intra-Op cholangiogram  . Hypothyroidism     H/O Graves Disease; took meds and then became hypothyroid  . Eczema   . Fatigue     with h/o normal overnight pulse oximetry  . Nephrocalcinosis     with h/o normal PTH/Cr/urine calcium  . Abdominal pain     s/p EGD per GI 2011    Past Surgical History  Procedure Date  . Esophagogastroduodenoscopy 05/25/2010    and Endo [place] - normal esoph; mild gastritis; normal CBD; GB absent; normal; no biliary stones; bx to check for H. Pylori and celiac sprue  . Cholecystectomy 12/2009  . Cesarean section     Family History  Problem Relation Age of Onset  . Heart disease Mother   . Hyperlipidemia Mother   . Hypertension Mother   . Kidney disease Mother     stones  . Diabetes Father   .  Cancer Neg Hx     No colon, breast or ovarian CA    History  Substance Use Topics  . Smoking status: Current Every Day Smoker -- 1.0 packs/day    Types: Cigarettes  . Smokeless tobacco: Not on file  . Alcohol Use: Yes     Rare    OB History    Grav Para Term Preterm Abortions TAB SAB Ect Mult Living                  Review of Systems  Constitutional: Negative for fever.  Respiratory: Negative for shortness of breath.   Cardiovascular: Negative for chest pain.  Gastrointestinal: Negative for nausea, vomiting, abdominal pain, diarrhea and constipation.    Allergies  Bupropion hcl; Trazodone hcl; and Venlafaxine  Home Medications   Current Outpatient Rx  Name Route Sig Dispense Refill  . AMPHETAMINE-DEXTROAMPHETAMINE 10 MG PO TABS Oral Take 5 mg by mouth 2 (two) times daily.      Marland Kitchen HYDROCODONE-ACETAMINOPHEN 5-500 MG PO TABS Oral Take 1 tablet by mouth at bedtime as needed. For pain     . IBUPROFEN 200 MG PO TABS Oral Take 400 mg by mouth daily as needed. For pain    . LEVOTHYROXINE SODIUM 88 MCG PO TABS Oral Take 88 mcg by mouth daily.  BP 132/84  Pulse 105  Temp 98 F (36.7 C) (Oral)  Resp 18  SpO2 100%  LMP 07/05/2012  Physical Exam  Nursing note and vitals reviewed. Constitutional: She is oriented to person, place, and time. She appears well-developed and well-nourished. No distress.  HENT:  Head: Normocephalic.  Cardiovascular: Normal rate and regular rhythm.   Pulmonary/Chest: Effort normal and breath sounds normal.  Neurological: She is alert and oriented to person, place, and time.  Skin: Skin is warm and dry.  Psychiatric: She has a normal mood and affect. Her behavior is normal.    ED Course  Procedures   Results for orders placed during the hospital encounter of 07/19/12  ACETAMINOPHEN LEVEL      Component Value Range   Acetaminophen (Tylenol), Serum <15.0  10 - 30 ug/mL  CBC      Component Value Range   WBC 10.7 (*) 4.0 - 10.5 K/uL    RBC 4.33  3.87 - 5.11 MIL/uL   Hemoglobin 13.3  12.0 - 15.0 g/dL   HCT 40.9  81.1 - 91.4 %   MCV 89.6  78.0 - 100.0 fL   MCH 30.7  26.0 - 34.0 pg   MCHC 34.3  30.0 - 36.0 g/dL   RDW 78.2  95.6 - 21.3 %   Platelets 265  150 - 400 K/uL  COMPREHENSIVE METABOLIC PANEL      Component Value Range   Sodium 141  135 - 145 mEq/L   Potassium 3.4 (*) 3.5 - 5.1 mEq/L   Chloride 102  96 - 112 mEq/L   CO2 26  19 - 32 mEq/L   Glucose, Bld 125 (*) 70 - 99 mg/dL   BUN 11  6 - 23 mg/dL   Creatinine, Ser 0.86  0.50 - 1.10 mg/dL   Calcium 9.5  8.4 - 57.8 mg/dL   Total Protein 7.4  6.0 - 8.3 g/dL   Albumin 4.3  3.5 - 5.2 g/dL   AST 22  0 - 37 U/L   ALT 27  0 - 35 U/L   Alkaline Phosphatase 62  39 - 117 U/L   Total Bilirubin 0.2 (*) 0.3 - 1.2 mg/dL   GFR calc non Af Amer 79 (*) >90 mL/min   GFR calc Af Amer >90  >90 mL/min  ETHANOL      Component Value Range   Alcohol, Ethyl (B) <11  0 - 11 mg/dL  SALICYLATE LEVEL      Component Value Range   Salicylate Lvl <2.0 (*) 2.8 - 20.0 mg/dL       1. Narcotic abuse       MDM  Patient seen and evaluated. Patient calm and cooperative. Patient denies any SI or HI at this time.    Spoke with [facility] act team. They will come see patient and evaluate for resources for treatment.    Angus Seller, PA 07/19/12 0530

## 2012-07-20 DIAGNOSIS — F1120 Opioid dependence, uncomplicated: Secondary | ICD-10-CM

## 2012-07-20 LAB — TSH: TSH: 1.847 u[IU]/mL (ref 0.350–4.500)

## 2012-07-20 NOTE — Progress Notes (Signed)
Patient ID: Katie Little, female   DOB: 03/24/1976, 36 y.o.   MRN: [id number]  [facility] Group Notes:  (Counselor/Nursing/MHT/Case Management/Adjunct)  07/20/2012 1:15 PM  Type of Therapy:  Group Therapy, Dance/Movement Therapy    Participation Level:  Did Not Attend   Modes of Intervention:  Clarification, Problem-solving, Role-play, Socialization and Support  Summary of Progress/Problems: Pt did not attend counseling group.  Pt. Received suicide prevention information and daily workbook earlier in the day.    Debarah Crape 07/20/2012. 2:51 PM

## 2012-07-20 NOTE — [facility] Suicide Risk Assessment (Signed)
Suicide Risk Assessment  Admission Assessment     Nursing information obtained from:  Patient Demographic factors:  Caucasian;Unemployed Current Mental Status:   (denies) Loss Factors:  Financial problems / change in socioeconomic status Historical Factors:    Risk Reduction Factors:  Responsible for children under 34 years of age;Sense of responsibility to family;Living with another person, especially a relative;Positive therapeutic relationship  CLINICAL FACTORS:   Alcohol/Substance Abuse/Dependencies  COGNITIVE FEATURES THAT CONTRIBUTE TO RISK:  Closed-mindedness    SUICIDE RISK:   Minimal: No identifiable suicidal ideation.  Patients presenting with no risk factors but with morbid ruminations; may be classified as minimal risk based on the severity of the depressive symptoms  PLAN OF CARE:  Mental Status Examination/Evaluation:  Objective: Appearance: Disheveled   Eye Contact:: Fair   Speech: Clear and Coherent and Normal Rate   Volume: Normal   Mood: Anxious, Depressed and Irritable   Affect: Constricted   Thought Process: Intact   Orientation: Full   Thought Content:no AVH/psychosis   Suicidal Thoughts: No-has passive fleeting thoughts no plan   Homicidal Thoughts: No   Memory: Immediate; Good   Judgement: Intact   Insight: Fair   Psychomotor Activity: Normal   Concentration: Good   Recall: Good   Akathisia: No   Handed: Right   AIMS (if indicated):   Assets: Communication Skills  Desire for Improvement  Resilience   Sleep:    Laboratory/X-Ray  Psychological Evaluation(s)      Assessment:  AXIS I: Opioid Dependance  AXIS II: Deferred  AXIS III:  Past Medical History   Diagnosis  Date   .  Anxiety      H/O episodic Alprazolam use   .  Anemia    .  Depression      Intolerant of Effexor, Lexapro in the past with SE's; states does not want to take again   .  Cholelithiasis  2011     S/P laproscopic cholecystectomy with normal Intra-Op cholangiogram   .   Hypothyroidism      H/O Graves Disease; took meds and then became hypothyroid   .  Eczema    .  Fatigue      with h/o normal overnight pulse oximetry   .  Nephrocalcinosis      with h/o normal PTH/Cr/urine calcium   .  Abdominal pain      s/p EGD per GI 2011    AXIS IV: economic problems, educational problems, housing problems, occupational problems, other psychosocial or environmental problems and problems with primary support group  AXIS V: 31-40 impairment in reality testing  Treatment Plan/Recommendations:  Treatment Plan Summary:  Daily contact with patient to assess and evaluate symptoms and progress in treatment  Medication management  Wonda Cerise 07/20/2012, 3:31 PM

## 2012-07-20 NOTE — Progress Notes (Signed)
Psychoeducational Group Note  Date:  07/20/2012 Time:  1000am  Group Topic/Focus:  Making Healthy Choices:   The focus of this group is to help patients identify negative/unhealthy choices they were using prior to admission and identify positive/healthier coping strategies to replace them upon discharge.  Participation Level:  Did Not Attend  Participation Quality:    Affect:   Cognitive:  Insight:  Engagement in Group:  Additional Comments:    Valente David 07/20/2012,10:45 AM

## 2012-07-20 NOTE — Progress Notes (Signed)
Psychoeducational Group Note  Date:  07/19/2012 Time:  2100  Group Topic/Focus:  Wrap-Up Group:   The focus of this group is to help patients review their daily goal of treatment and discuss progress on daily workbooks.  Participation Level: Did Not Attend  Participation Quality:  Not Applicable  Affect:  Not Applicable  Cognitive:  Not Applicable  Insight:  Not Applicable  Engagement in Group: Not Applicable  Additional Comments:  Pt remained in bed due to not feeling well and arriving on unit only a few hours before group began.  Shelah Lewandowsky 07/20/2012, 3:57 AM

## 2012-07-20 NOTE — Progress Notes (Signed)
[facility] Group Notes:  (Counselor/Nursing/MHT/Case Management/Adjunct)  07/20/2012 6:43 PM  Type of Therapy:  Psychoeducational Skills  Participation Level:  Did Not Attend  Dalia Heading 07/20/2012, 6:43 PM

## 2012-07-20 NOTE — H&P (Signed)
  Pt was seen by me today and I agree with the key elements documented in H&P.  

## 2012-07-20 NOTE — Progress Notes (Signed)
Spoke with pt 1:1 who remains quite flat and depressed. Interaction minimal, pt guarded though pleasant in her interactions. Reports all over body aches, fatigue and malaise. COWS "2". Pt did go down for meals earlier today and went out for rec time. Also attended AA tonight. Pt continues to run a low grade temp and reports some vague discomfort vaginally. UA not done prior to admit, note left for MD. Clonidine was held due to low BP. Fluids encouraged. Pt given support. Robaxin prn given for aches. Pt denies SI/HI/AVH. She remains safe at present. Lawrence Marseilles

## 2012-07-20 NOTE — Progress Notes (Signed)
Patient ID: Katie Little, female   DOB: 09-04-1976, 36 y.o.   MRN: [id number] 07-20-12 nursing shift note: D: pt has been in the bed most of the am and is taking her medications off schedule.  She didn't go to her am group. A: rn attempted to stimulate the pt into becoming involved in the milieu. Also rn put the pt with a nursing student to help motivate her. R: she agreed to go to the 3:15pm group and agreed to having a nursing student. rn will continue to monitor and q 15 min cks continue.

## 2012-07-20 NOTE — Progress Notes (Signed)
Pt. did not attend participated in aftercare planning group. Pt. Accepted daily workbook on Support Systems.  
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
## 2012-07-20 NOTE — Progress Notes (Signed)
Met with pt 1:1 who has been in bed sleeping all evening. Did not attend group. Pt complaining of generalized aches and some anxiety. She is flat and depressed. COWS is a 3, temp is 99.4. Robaxin given for pain. Pt supported and encouraged. Encouraged to push fluids. On reassessment of pain, pt is asleep. She denies SI/HI/AVH and remains safe. Lawrence Marseilles

## 2012-07-20 NOTE — Progress Notes (Signed)
  Katie Little is a 36 y.o. female [id number] 03-13-1976  07/19/2012 Principal Problem:  *Opiate dependence, continuous   Mental Status: Seen in room in bed. Denies SI/HI/AVH.   Subjective/Objective: Complaining of muscle cramps not trying to help herself at all.    Filed Vitals:   07/20/12 0701  BP: 82/52  Pulse: 77  Temp:   Resp:     Lab Results:   BMET    Component Value Date/Time   NA 141 07/19/2012 0208   K 3.4* 07/19/2012 0208   CL 102 07/19/2012 0208   CO2 26 07/19/2012 0208   GLUCOSE 125* 07/19/2012 0208   BUN 11 07/19/2012 0208   CREATININE 0.94 07/19/2012 0208   CALCIUM 9.5 07/19/2012 0208   CALCIUM 8.8 04/20/2010 0000   GFRNONAA 79* 07/19/2012 0208   GFRAA >90 07/19/2012 0208    Medications:  Scheduled:     . cloNIDine  0.1 mg Oral QID   Followed by  . cloNIDine  0.1 mg Oral BH-qamhs   Followed by  . cloNIDine  0.1 mg Oral QAC breakfast  . hydrOXYzine  50 mg Oral QHS,MR X 1  . levothyroxine  88 mcg Oral Q0600  . nicotine  21 mg Transdermal Daily  . potassium chloride  40 mEq Oral Once  . DISCONTD: nicotine  21 mg Transdermal Daily     PRN Meds acetaminophen, alum & mag hydroxide-simeth, dicyclomine, hydrOXYzine, loperamide, magnesium hydroxide, methocarbamol, naproxen, ondansetron, ondansetron, DISCONTD: acetaminophen, DISCONTD: acetaminophen, DISCONTD: alum & mag hydroxide-simeth, DISCONTD: ibuprofen  Plan: Continue with current plan of care. Advised patient that she needs to get up after lunch and attend group.   Akita Maxim,MICKIE D. 07/20/2012

## 2012-07-21 NOTE — Progress Notes (Signed)
Nutrition Brief Note  Intervention: Encouraged small frequent meals and snacks to improve appetite and nutrition.   Patient identified on the Malnutrition Screening Tool (MST) report for unintended weight loss and poor appetite, generating a score of 2.   Body mass index is 26.26 kg/(m^2). Pt meets criteria for overweight based on current BMI.   - Pt reports her appetite has been poor for the past few days. Pt reports PTA she was eating good, denies any recent weight loss. Pt reports eating better today and does not want any nutritional supplements.   No nutrition interventions warranted at this time. If nutrition issues arise, please consult RD.   Levon Hedger MS, RD, LDN [phone] Pager [phone] After Hours Pager

## 2012-07-21 NOTE — Progress Notes (Signed)
Patient ID: Katie Little, female   DOB: 11-Aug-1976, 36 y.o.   MRN: [id number] D: Pt was wrapped in a blanket in the dayroom and talking to her peers. Pt stated, "pills exacerbated her underlining depression". Stated she didn't want to kill herself because she's, "actually too terrified to die, and not ready for that".  Pt had no questions or concerns about her stay.  A: Continue to monitor 15 min checks for safety. Encouragement and support was offered.   R: Pt remains safe.

## 2012-07-21 NOTE — Progress Notes (Signed)
Psychoeducational Group Note  Date:  07/21/2012 Time:  1100  Group Topic/Focus:  Wellness Toolbox:   The focus of this group is to discuss various aspects of wellness, balancing those aspects and exploring ways to increase the ability to experience wellness.  Patients will create a wellness toolbox for use upon discharge.  Participation Level: Did Not Attend  Participation Quality:  Not Applicable  Affect:  Not Applicable  Cognitive:  Not Applicable  Insight:  Not Applicable  Engagement in Group: Not Applicable  Additional Comments:  Pt remained lying in bed for this group.   Sharyn Lull 07/21/2012, 1:32 PM

## 2012-07-21 NOTE — Progress Notes (Signed)
Patient did attend the evening speaker AA meeting.  

## 2012-07-21 NOTE — Progress Notes (Signed)
[facility] Group Notes:  (Counselor/Nursing/MHT/Case Management/Adjunct)    Type of Therapy:  Group Therapy 1:15 to 2:30   Participation Level:  Active  Participation Quality:  Attentive and Sharing  Affect:  Flat  Cognitive:  Alert and Oriented  Insight:  Limited  Engagement in Group:  Good  Engagement in Therapy:  Limited  Modes of Intervention:  Clarification, Socialization and Support  Summary of Progress/Problems:  Group discussion focused on what patient's see as their own obstacles to recovery.  Comfort shared belief that her surroundings will be difficult to deal with as they are very conducive to her using.  Katie Little's body language showed identification with the discussion on vulnerability.   [name] 07/21/2012, 3:58 PM

## 2012-07-21 NOTE — Progress Notes (Signed)
[facility] In Patient Progress Note 07/21/2012 2:18 PM Katie Little 1976-07-15 [id number] Hospital day #:2 Diagnosis:  Axis I: Opioid Dependance  ADL's:  Intact Sleep:  Minimally improved Appetite:ok  Groups:Limited  Subjective:   height is 5\' 1"  (1.549 m) and weight is 63.05 kg (139 lb). Her oral temperature is 98.8 F (37.1 C). Her blood pressure is 82/54 and her pulse is 87. Her respiration is 16.   Objective: Sx of withdrawal: muscle spasms  Ros: ROS: Constitional: WDWN Adult in NAD COR: negative for SOB, CP, cough, wheezing GI: Negative for Nausea, vomiting, diarrhea, constipation, abdominal pain Neuro: negative for dizziness, blurred vision, headaches, numbness or tingling Ortho: negative for limb pain, swelling, change in ambulatory status.  Mental Status Exam Level of Consciousness: awake Orientation: x 3 General Appearance : resting in bed Behavior:   cooperative Eye Contact:  fair Motor Behavior: normal   Speech:  Clear and goal directed Mood:  Minimal depression  Suicidal Ideation: No suicidal ideation, no plan, no intent, no means. Homicidal Ideation:  No homicidal ideation, no plan, no intent, no means.  Affect:  congruent Anxiety Level:  minimal Thought Process:  linear Thought Content:  normal Perception:  intact Judgment:  fair Insight:  fair Cognition:  At least average Sleep:  Number of Hours: 4.5   Lab Results:  Results for orders placed during the hospital encounter of 07/19/12 (from the past 48 hour(s))  TSH     Status: Normal   Collection Time   07/20/12  6:45 AM      Component Value Range Comment   TSH 1.847  0.350 - 4.500 uIU/mL    Labs are reviewed. Physical Findings: AIMS: CIWA:  CIWA-Ar Total: 1  COWS:  COWS Total Score: 2  Medication:   . cloNIDine  0.1 mg Oral QID   Followed by  . cloNIDine  0.1 mg Oral BH-qamhs   Followed by  . cloNIDine  0.1 mg Oral QAC breakfast  . hydrOXYzine  50 mg Oral QHS,MR X 1  . levothyroxine  88 mcg  Oral Q0600  . nicotine  21 mg Transdermal Daily   Treatment Plan Summary: 1. Admit for crisis management and stabilization. 2. Medication management to reduce current symptoms to base line and improve the patient's overall level of functioning 3. Treat health problems as indicated. 4. Develop treatment plan to decrease risk of relapse upon discharge and the need for readmission. 5. Psycho-social education regarding relapse prevention and self care. 6. Health care follow up as needed for medical problems. 7. Restart home medications where appropriate.   Plan: 1. Continue the clonidine protocol until completed. 2. Continue the synthroid as written. 3. Will follow Lloyd Huger T. Iyari Hagner PAC 07/21/2012, 2:18 PM

## 2012-07-21 NOTE — Progress Notes (Signed)
Patient ID: Katie Little, female   DOB: 10-Jul-1976, 36 y.o.   MRN: [id number] She was up for am medication and meal and has been in bed since. Stated that the medication made her sleepy. Self inventory:  Slept poorly.appitite good, attention increasing, depression 4, hopelessness 5, w/d tremors agitation, denies SI thoughts.

## 2012-07-21 NOTE — Discharge Planning (Signed)
Katie Little attended AM group.  States her withdrawal symptoms are "manageable".  Was laying down for some of group but willing to sit up when we were talking.  Has 34 yo son staying with his grandmother while she is here.  Wants to return home soon [today or tomorrow] and will follow up with outpt therapy.  I suggested [facility] or [facility].

## 2012-07-21 NOTE — Progress Notes (Signed)
Psychoeducational Group Note  Date:  07/21/2012 Time:  1000 Group Topic/Focus: Therapeutic Activity-"Question Ball"  Participation Level:  Active  Participation Quality:  Appropriate  Affect:  Appropriate  Cognitive:  Appropriate  Insight:  Good  Engagement in Group:  Good  Additional Comments:  Pt was appropriate and sharing while attending group. Pt was willing to answer questions when the ball was passed.  Katie Little, Katie Little 07/21/2012, 4:57 PM

## 2012-07-21 NOTE — Progress Notes (Signed)
[facility] Adult Inpatient Family/Significant Other Suicide Prevention Education  Suicide Prevention Education:  Education Completed; Essynce Munsch, father, at [phone] and [phone] has been identified by the patient as the family member who will aid the patient in the event of a mental health crisis (suicidal ideations/suicide attempt).  With written consent from the patient, the family member/significant other has been provided the following suicide prevention education, prior to the and/or following the discharge of the patient.  The suicide prevention education provided includes the following:  Suicide risk factors  Suicide prevention and interventions  National Suicide Hotline telephone number  [facility] assessment telephone number  [facility] Emergency Assistance 911  [facility] and/or Residential Mobile Crisis Unit telephone number  Request made of family/significant other to:  Remove weapons (e.g., guns, rifles, knives), all items previously/currently identified as safety concern.    Remove drugs/medications (over-the-counter, prescriptions, illicit drugs), all items previously/currently identified as a safety concern.  Mr Koelzer states patient has no firearms in her home.  He does have firearms in his home which are secured in a cabinet which patient does not have combination to.   The family member/significant other verbalizes understanding of the suicide prevention education information provided.  The family member/significant other agrees to remove the items of safety concern listed above.  [name] 07/21/2012, 10:57 AM

## 2012-07-22 DIAGNOSIS — F1120 Opioid dependence, uncomplicated: Secondary | ICD-10-CM

## 2012-07-22 MED ORDER — LEVOTHYROXINE SODIUM 88 MCG PO TABS: 88.0000 ug | ORAL_TABLET | Freq: Every day | ORAL | Status: DC

## 2012-07-22 NOTE — Progress Notes (Addendum)
[facility] Group Notes:  (Counselor/Nursing/MHT/Case Management/Adjunct)  07/22/2012 4:01 PM  Type of Therapy:  Psychoeducational Skills  Participation Level:  Minimal  Participation Quality:  Inattentive, Redirectable and Resistant  Affect:  Appropriate  Cognitive:  Alert, Appropriate and Oriented  Insight:  Good  Engagement in Group:  Limited  Engagement in Therapy:  n/a  Modes of Intervention:  Activity, Education, Problem-solving, Socialization and Support  Summary of Progress/Problems: Kezia attended Psychoeducational group on labels. Loreley participated with prompting in activity labeling self and peers and choose to label self as an Artist for the activity. Biddie was quiet and appeared not to feel well, looking down, and wrapped in a blanket while group discussed what labels are, how we use them, and listed positive and negative labels the have used or been called. Lakrystal was given a homework assignment to list 10 words she has been labeled and to find the reality of the word/situation.    Wandra Scot 07/22/2012, 4:01 PM

## 2012-07-22 NOTE — Treatment Plan (Signed)
Interdisciplinary Treatment Plan Update (Adult)  Date: 07/22/2012  Time Reviewed: 11:14 AM   Progress in Treatment: Attending groups: Yes Participating in groups: Yes Taking medication as prescribed: Yes Tolerating medication: Yes   Family/Significant other contact made:  Yes Patient understands diagnosis:  Yes As evidenced by asking for help opiate dependence Discussing patient identified problems/goals with staff:  Yes  See below Medical problems stabilized or resolved:  Yes Denies suicidal/homicidal ideation: Yes  In tx team Issues/concerns per patient self-inventory:  None Other:  New problem(s) identified: N/A  Reason for Continuation of Hospitalization: Other; describe D/C today  Interventions implemented related to continuation of hospitalization:   Additional comments:  Estimated length of stay:  Discharge Plan:  New goal(s): N/A  Review of initial/current patient goals per problem list:   1.  Goal(s):Eliminate SI  Met:  Yes  Target date:9/17  As evidenced by: self report  2.  Goal (s):Safely detox from opiates  Met:  Yes  Target date:9/17  As evidenced ZO:XWRUEA vitals, no withdrawal symptoms  3.  Goal(s): Identify comprehensive sobriety plan  Met:  Yes  Target date:9/17  As evidenced VW:[id number] report of following up at [facility]  4.  Goal(s):  Met:  Yes  Target date:  As evidenced by:  Attendees: Patient:  Katie Little 07/22/2012 11:14 AM  Family:     Physician:  Lupe Carney 07/22/2012 11:14 AM   Nursing:  Barrie Folk  07/22/2012 11:14 AM   Case Manager:  Richelle Ito, LCSW 07/22/2012 11:14 AM   Counselor:  Ronda Fairly, LCSWA 07/22/2012 11:14 AM   Other:     Other:     Other:     Other:      Scribe for Treatment Team:   Ida Rogue, 07/22/2012 11:14 AM

## 2012-07-22 NOTE — [facility] Suicide Risk Assessment (Signed)
Suicide Risk Assessment  Discharge Assessment      Demographic factors:  Caucasian;Unemployed  Loss Factors:  Financial problems / change in socioeconomic status  Historical Factors:  f/u at [facility]; passive SI in past   Risk Reduction Factors:  Responsible for children under 34 years of age;Sense of responsibility to family;Living with another person, especially a relative;Positive therapeutic relationship  Current Mental Status by Physician: Patient seen and evaluated. Chart reviewed. Patient stated that her mood was "good". Her affect was mood congruent and euthymic. She denied any current thoughts of self injurious behavior, suicidal ideation or homicidal ideation. There were no auditory or visual hallucinations, paranoia, delusional thought processes, or mania noted.  Thought process was linear and goal directed.  No psychomotor agitation or retardation was noted. Speech was normal rate, tone and volume. Eye contact was good. Judgment and insight are fair.  Patient has been up and engaged on the unit.  No acute safety concerns reported from team.  Denied any w/d s/s.  Discharge Diagnoses: Opioid Use Disorder; Depressive Disorder Unspecified; ADHD, per Hx on Adderall; Hypothyroidism   Past Medical History  Diagnosis Date  . Anxiety     H/O episodic Alprazolam use  . Anemia   . Depression     Intolerant of Effexor, Lexapro in the past with SE's; states does not want to take again  . Cholelithiasis 2011    S/P laproscopic cholecystectomy with normal Intra-Op cholangiogram  . Hypothyroidism     H/O Graves Disease; took meds and then became hypothyroid  . Eczema   . Fatigue     with h/o normal overnight pulse oximetry  . Nephrocalcinosis     with h/o normal PTH/Cr/urine calcium  . Abdominal pain     s/p EGD per GI 2011    Cognitive Features That Contribute To Risk: none.  Suicide Risk: Pt viewed as a chronic moderate increased risk of harm to self in light of her past  hx and risk factors.  No acute safety concerns on the unit.  Pt contracting for safety and is stable for discharge home.  Plan Of Care/Follow-up recommendations: Pt seen and evaluated in treatment team. Chart reviewed.  Pt stable for and requesting discharge home with f/u at the [facility].  Pt advised against restarting not only opiates, but prescription use of Xanax and Adderall in light of Hx.  Pt contracting for safety and does not currently meet [place] involuntary commitment criteria for continued hospitalization against her will.  Mental health treatment, medication management and continued sobriety will mitigate against the potential increased risk of harm to self and/or others.  Discussed the importance of recovery further with pt, as well as, tools to move forward in a healthy & safe manner.  Pt agreeable with the plan.  Discussed with the team.  Please see orders, follow up appointments per AVS and full discharge summary. Recommend follow up with NA.  Diet: Regular.  Activity: As tolerated.      Katie Little 07/22/2012, 11:59 AM

## 2012-07-22 NOTE — Progress Notes (Signed)
Psychoeducational Group Note  Date:  07/22/2012 Time:  1100  Group Topic/Focus:  Recovery Goals:   The focus of this group is to identify appropriate goals for recovery and establish a plan to achieve them.  Participation Level: Did Not Attend  Participation Quality:  Not Applicable  Affect:  Not Applicable  Cognitive:  Not Applicable  Insight:  Not Applicable  Engagement in Group: Not Applicable  Additional Comments:  Pt did not attend group.   Sharyn Lull 07/22/2012, 1:17 PM

## 2012-07-22 NOTE — Progress Notes (Signed)
[facility] Case Management Discharge Plan:  Will you be returning to the same living situation after discharge: Yes,  home At discharge, do you have transportation home?:Yes,  mother Do you have the ability to pay for your medications:Yes,  insurance  Interagency Information:     Release of information consent forms completed and in the chart;  Patient's signature needed at discharge.  Patient to Follow up at:  Follow-up Information    Follow up with [facility]. On 07/24/2012. (10:00 with Viv[facility] for assessment for services.  Call 24 hrs ahead to reschedule if this does not work for you.)    Contact information:   [address]  [place]  [phone]         Patient denies SI/HI:   Yes,  yes    Aeronautical engineer and Suicide Prevention discussed:  Yes,  yes  Barrier to discharge identified:No.  Summary and Recommendations:   Katie Little 07/22/2012, 12:02 PM

## 2012-07-22 NOTE — Progress Notes (Signed)
Patient ID: Katie Little, female   DOB: 1975-12-09, 36 y.o.   MRN: [id number] She was discharged home, was picked up by a friend. She voiced understanding of  Discharge instruction and of follow up plan. She denies thoughts of SI, all belonging taken home with her.

## 2012-07-23 NOTE — Progress Notes (Signed)
Patient Discharge Instructions:  After Visit Summary (AVS):   Faxed to:  07/23/2012 Psychiatric Admission Assessment Note:   Faxed to:  07/23/2012 Suicide Risk Assessment - Discharge Assessment:   Faxed to:  07/23/2012 Faxed/Sent to the Next Level Care provider:  07/23/2012  Faxed to [facility] @ [phone]  Wandra Scot, 07/23/2012, 2:20 PM

## 2012-08-28 NOTE — Discharge Summary (Signed)
Physician Discharge Summary Note   Patient:  Katie Little is an 36 y.o., female MRN:  [id number] DOB:  [birth year] Patient phone:  [phone] (home)  Patient address:   [address] [place] [address]  Date of Admission:  07/19/2012 Date of Discharge: 07/22/12  Reason for Admission: Principal Problem:  *Opiate dependence, continuous  Discharge Diagnoses: Opioid Use Disorder; Depressive Disorder Unspecified; ADHD, per Hx on Adderall; Hypothyroidism   Past Medical History   Diagnosis  Date   .  Anxiety      H/O episodic Alprazolam use   .  Anemia    .  Depression      Intolerant of Effexor, Lexapro in the past with SE's; states does not want to take again   .  Cholelithiasis  2011     S/P laproscopic cholecystectomy with normal Intra-Op cholangiogram   .  Hypothyroidism      H/O Graves Disease; took meds and then became hypothyroid   .  Eczema    .  Fatigue      with h/o normal overnight pulse oximetry   .  Nephrocalcinosis      with h/o normal PTH/Cr/urine calcium   .  Abdominal pain      s/p EGD per GI 2011     Suicide Risk: Pt viewed as a chronic moderate increased risk of harm to self in light of her past hx and risk factors. No acute safety concerns on the unit. Pt contracting for safety and is stable for discharge home.   Level of Care:  OP  Hospital Course:  Pt admitted for crisis stabilization, detox and treatment. Pt attended all therapeutic groups, was active in her treatment planning process and agreed to her current medication regimen for further stability during recovery.  There were no acute issues during treatment and she was open to further substance abuse Tx.  All labs were reviewed with her in great detail and medical needs were addressed.  No acute safety issues were noted on the unit. Medications were reviewed with pt and medication education was provided. Mental health treatment, medication management and continued sobriety will mitigate against any  increased risk of harm to self and/or others.  Discussed the importance of recovery with pt, as well as, tools to move forward in a healthy & safe manner using the 12 Step Process.    Consults:  None  Significant Diagnostic Studies:  See labs.  Discharge Vitals:   Blood pressure 96/65, pulse 70, temperature 98.2 F (36.8 C), temperature source Oral, resp. rate 18, height 5\' 1"  (1.549 m), weight 63.05 kg (139 lb), last menstrual period 06/24/2012.  Physical Findings: AIMS: Facial and Oral Movements Muscles of Facial Expression: None, normal Lips and Perioral Area: None, normal Jaw: None, normal Tongue: None, normal,Extremity Movements Upper (arms, wrists, hands, fingers): None, normal Lower (legs, knees, ankles, toes): None, normal, Trunk Movements Neck, shoulders, hips: None, normal, Overall Severity Severity of abnormal movements (highest score from questions above): None, normal Incapacitation due to abnormal movements: None, normal Patient's awareness of abnormal movements (rate only patient's report): No Awareness, Dental Status Current problems with teeth and/or dentures?: No Does patient usually wear dentures?: No  CIWA:  CIWA-Ar Total: 1  COWS:  COWS Total Score: 2   Mental Status Exam: See Mental Status Examination and Suicide Risk Assessment completed by Attending Physician prior to discharge.  Discharge destination:  Home  Is patient on multiple antipsychotic therapies at discharge:  No   Has Patient  had three or more failed trials of antipsychotic monotherapy by history:  No  Recommended Plan for Multiple Antipsychotic Therapies: NA     Medication List     As of 08/28/2012  6:37 PM    STOP taking these medications         ALPRAZolam 0.5 MG tablet   Commonly known as: XANAX      amphetamine-dextroamphetamine 10 MG tablet   Commonly known as: ADDERALL      HYDROcodone-acetaminophen 5-325 MG per tablet   Commonly known as: NORCO/VICODIN      promethazine  25 MG tablet   Commonly known as: PHENERGAN      TAKE these medications      Indication    levothyroxine 88 MCG tablet   Commonly known as: SYNTHROID, LEVOTHROID   Take 1 tablet (88 mcg total) by mouth daily. For hypothyroidism            Follow-up Information    Follow up with [facility]. On 07/24/2012. (10:00 with Vivian[facility] for assessment for services.  Call 24 hrs ahead to reschedule if this does not work for you.)    Contact information:   [address]  [place]  [phone]         Plan Of Care/Follow-up recommendations: Pt seen and evaluated in treatment team. Chart reviewed. Pt stable for and requesting discharge home with f/u at the [facility]. Pt advised against restarting not only opiates, but prescription use of Xanax and Adderall in light of Hx. Pt contracting for safety and does not currently meet [place] involuntary commitment criteria for continued hospitalization against her will. Mental health treatment, medication management and continued sobriety will mitigate against the potential increased risk of harm to self and/or others. Discussed the importance of recovery further with pt, as well as, tools to move forward in a healthy & safe manner. Pt agreeable with the plan. Discussed with the team.  Recommend follow up with NA. Diet: Regular. Activity: As tolerated.   Signed: Lupe Carney 08/28/2012, 6:37 PM

## 2012-10-01 ENCOUNTER — Ambulatory Visit: Payer: Self-pay | Admitting: Obstetrics and Gynecology

## 2012-10-21 ENCOUNTER — Ambulatory Visit: Payer: Self-pay | Admitting: Obstetrics & Gynecology

## 2012-10-21 LAB — CBC
HCT: 38.4 % (ref 35.0–47.0)
HGB: 12.8 g/dL (ref 12.0–16.0)
MCH: 30.3 pg (ref 26.0–34.0)
MCHC: 33.4 g/dL (ref 32.0–36.0)
MCV: 91 fL (ref 80–100)
Platelet: 284 10*3/uL (ref 150–440)
RDW: 13.7 % (ref 11.5–14.5)
WBC: 11.8 10*3/uL — ABNORMAL HIGH (ref 3.6–11.0)

## 2012-10-21 LAB — PREGNANCY, URINE: Pregnancy Test, Urine: NEGATIVE m[IU]/mL

## 2012-10-30 ENCOUNTER — Ambulatory Visit: Payer: Self-pay | Admitting: Obstetrics & Gynecology

## 2012-10-31 LAB — HEMOGLOBIN: HGB: 11.1 g/dL — ABNORMAL LOW (ref 12.0–16.0)

## 2013-01-21 ENCOUNTER — Emergency Department: Payer: Self-pay | Admitting: Emergency Medicine

## 2013-01-21 LAB — URINALYSIS, COMPLETE
Glucose,UR: NEGATIVE mg/dL (ref 0–75)
Squamous Epithelial: 1

## 2013-01-21 LAB — COMPREHENSIVE METABOLIC PANEL
Albumin: 4.1 g/dL (ref 3.4–5.0)
Alkaline Phosphatase: 65 U/L (ref 50–136)
Anion Gap: 8 (ref 7–16)
BUN: 12 mg/dL (ref 7–18)
Chloride: 105 mmol/L (ref 98–107)
Co2: 26 mmol/L (ref 21–32)
EGFR (African American): >60
EGFR (Non-African Amer.): >60
Glucose: 120 mg/dL — ABNORMAL HIGH (ref 65–99)
Potassium: 3 mmol/L — ABNORMAL LOW (ref 3.5–5.1)

## 2013-01-21 LAB — CBC
HCT: 38.5 % (ref 35.0–47.0)
MCH: 30.6 pg (ref 26.0–34.0)
RDW: 13.1 % (ref 11.5–14.5)
WBC: 10.8 10*3/uL (ref 3.6–11.0)

## 2013-01-21 LAB — PREGNANCY, URINE: Pregnancy Test, Urine: NEGATIVE m[IU]/mL

## 2013-03-09 ENCOUNTER — Ambulatory Visit: Payer: Self-pay | Admitting: Obstetrics & Gynecology

## 2013-03-18 ENCOUNTER — Ambulatory Visit: Payer: Self-pay | Admitting: Obstetrics & Gynecology

## 2013-04-19 ENCOUNTER — Emergency Department: Payer: Self-pay | Admitting: Emergency Medicine

## 2013-04-19 LAB — COMPREHENSIVE METABOLIC PANEL
Alkaline Phosphatase: 68 U/L (ref 50–136)
BUN: 9 mg/dL (ref 7–18)
Calcium, Total: 9 mg/dL (ref 8.5–10.1)
Chloride: 107 mmol/L (ref 98–107)
Creatinine: 0.75 mg/dL (ref 0.60–1.30)
EGFR (African American): >60
EGFR (Non-African Amer.): >60
Glucose: 88 mg/dL (ref 65–99)
Potassium: 4.2 mmol/L (ref 3.5–5.1)
SGPT (ALT): 34 U/L (ref 12–78)
Sodium: 140 mmol/L (ref 136–145)
Total Protein: 7.7 g/dL (ref 6.4–8.2)

## 2013-04-19 LAB — CBC
MCH: 30.6 pg (ref 26.0–34.0)
MCHC: 34.2 g/dL (ref 32.0–36.0)
MCV: 89 fL (ref 80–100)
RBC: 4.67 10*6/uL (ref 3.80–5.20)

## 2013-05-09 ENCOUNTER — Ambulatory Visit: Payer: Self-pay | Admitting: Cardiology

## 2013-10-23 ENCOUNTER — Encounter: Payer: Self-pay | Admitting: Cardiovascular Disease

## 2014-04-18 ENCOUNTER — Emergency Department: Payer: Self-pay | Admitting: Emergency Medicine

## 2014-04-18 LAB — URINALYSIS, COMPLETE
BILIRUBIN, UR: NEGATIVE
Glucose,UR: NEGATIVE mg/dL (ref 0–75)
Ketone: NEGATIVE
Leukocyte Esterase: NEGATIVE
Nitrite: NEGATIVE
Ph: 7 (ref 4.5–8.0)
Protein: NEGATIVE
SPECIFIC GRAVITY: 1.004 (ref 1.003–1.030)
Squamous Epithelial: 2
WBC UR: 1 /HPF (ref 0–5)

## 2014-04-18 LAB — CBC WITH DIFFERENTIAL/PLATELET
BASOS PCT: 0.5 %
Basophil #: 0.1 10*3/uL (ref 0.0–0.1)
EOS PCT: 1.3 %
Eosinophil #: 0.2 10*3/uL (ref 0.0–0.7)
HCT: 41.1 % (ref 35.0–47.0)
HGB: 13.8 g/dL (ref 12.0–16.0)
LYMPHS PCT: 16.2 %
Lymphocyte #: 2.7 10*3/uL (ref 1.0–3.6)
MCH: 30.4 pg (ref 26.0–34.0)
MCHC: 33.5 g/dL (ref 32.0–36.0)
MCV: 91 fL (ref 80–100)
MONO ABS: 0.8 x10 3/mm (ref 0.2–0.9)
Monocyte %: 5.1 %
Neutrophil #: 12.6 10*3/uL — ABNORMAL HIGH (ref 1.4–6.5)
Neutrophil %: 76.9 %
PLATELETS: 241 10*3/uL (ref 150–440)
RBC: 4.53 10*6/uL (ref 3.80–5.20)
RDW: 12.9 % (ref 11.5–14.5)
WBC: 16.4 10*3/uL — ABNORMAL HIGH (ref 3.6–11.0)

## 2014-04-18 LAB — COMPREHENSIVE METABOLIC PANEL
ALBUMIN: 3.6 g/dL (ref 3.4–5.0)
Alkaline Phosphatase: 56 U/L
Anion Gap: 7 (ref 7–16)
BILIRUBIN TOTAL: 0.4 mg/dL (ref 0.2–1.0)
BUN: 13 mg/dL (ref 7–18)
CALCIUM: 8.7 mg/dL (ref 8.5–10.1)
CREATININE: 0.87 mg/dL (ref 0.60–1.30)
Chloride: 108 mmol/L — ABNORMAL HIGH (ref 98–107)
Co2: 23 mmol/L (ref 21–32)
EGFR (African American): >60
Glucose: 92 mg/dL (ref 65–99)
OSMOLALITY: 275 (ref 275–301)
Potassium: 3.9 mmol/L (ref 3.5–5.1)
SGOT(AST): 26 U/L (ref 15–37)
SGPT (ALT): 22 U/L (ref 12–78)
Sodium: 138 mmol/L (ref 136–145)
Total Protein: 7.1 g/dL (ref 6.4–8.2)

## 2014-04-19 LAB — HEMOGLOBIN: HGB: 13.9 g/dL (ref 12.0–16.0)

## 2014-04-19 LAB — HEMATOCRIT: HCT: 42.2 % (ref 35.0–47.0)

## 2014-04-19 LAB — LIPASE, BLOOD: Lipase: 95 U/L (ref 73–393)

## 2015-02-22 NOTE — Op Note (Signed)
PATIENT NAME:  Katie Little, Katie Little MR#:  [id number] DATE OF BIRTH:  27-Jun-1976  DATE OF PROCEDURE:  10/30/2012  PREOPERATIVE DIAGNOSIS: Menorrhagia and endometriosis.  POSTOPERATIVE DIAGNOSIS: Menorrhagia and endometriosis.  PROCEDURE PERFORMED: Laparoscopic supracervical hysterectomy with left salpingectomy.   SURGEON: Dierdre Searles, MD  ANESTHESIA: General.   BLOOD LOSS: 25 mL.    COMPLICATIONS: None.   FINDINGS: Endometriosis, particularly of the left fallopian lobe and anterior peritoneum.  DISPOSITION: To the recovery room in stable condition.   TECHNIQUE: The patient is prepped and draped in the usual sterile fashion after adequate anesthesia is obtained in the dorsal lithotomy position. Little Foley catheter is inserted into the bladder and Little sponge stick is placed per vagina.   Attention is then turned to the abdomen where Little Veress needle is inserted through Little 5 mm incision just inferior to the umbilicus after Marcaine is used to anesthetize the skin. Veress needle placement is confirmed using the hanging drop technique and the abdomen is then insufflated with CO2 gas. Little 5 mm trocar is then inserted under direct visualization with the laparoscope with no injuries or bleeding noted. The patient is placed in Trendelenburg position and the above-mentioned findings are visualized.   An 11 mm trocar is placed in the right lower quadrant and Little 5 mm trocar is placed in the left lower quadrant lateral to the inferior epigastric blood vessels with no injuries or bleeding noted. The left fallopian tube was grasped and is carefully dissected free using Little 5 mm Harmonic scalpel with preservation of the left ovary and its main blood supply. The round ligaments on both sides are carefully coagulated and cut. The utero-ovarian vessels and ligaments are carefully coagulated with Kleppinger bipolar cautery device and 5 mm Harmonic scalpel and dissection is carried down to the level of the uterine arteries. The  uterine arteries are coagulated and then cut and the uterus is amputated with approximately 1 cm of cervix left in place. The endocervical canal is cauterized.   Little morcellator device is placed through the right lower quadrant incision after removal of the trocar and then the uterus is removed by morcellation process. It is sent to pathology for further review along with the left fallopian tube. Interceed is placed over the cervical stump and no bleeding is noted and no apparent injury to ureter, bowel, bladder or other structures is seen. The pelvic cavity is irrigated with aspiration of fluid. The right lower quadrant incision is closed with Little Vicryl suture for the rectus fascia. Gas is expelled, trocars are removed and skin is closed with Dermabond. The patient goes to the recovery room in stable condition. All sponge, instrument and needle counts are correct. ____________________________ R. Annamarie Major, MD rph:sb D: 10/30/2012 11:06:56 ET T: 10/30/2012 11:38:08 ET JOB#: [id number]  cc: Dierdre Searles, MD, <Dictator> Nadara Mustard MD ELECTRONICALLY SIGNED 10/30/2012 14:41

## 2015-02-25 NOTE — Op Note (Signed)
PATIENT NAME:  Katie Little, Katie Little MR#:  [id number] DATE OF BIRTH:  [birth year]  DATE OF PROCEDURE:  03/18/2013  PREOPERATIVE DIAGNOSIS: Abnormal uterine bleeding status post hysterectomy (supracervical).   POSTOPERATIVE DIAGNOSIS: Abnormal uterine bleeding status post hysterectomy (supracervical).   PROCEDURE: LEEP procedure.   SURGEON: Dierdre Searles, MD  ANESTHESIA: General.   ESTIMATED BLOOD LOSS: Minimal.   COMPLICATIONS: None.   DISPOSITION: To recovery room stable.   TECHNIQUE: The patient is prepped and draped in the usual sterile fashion after adequate anesthesia is obtained in the dorsal lithotomy position. Little speculum is placed and the anterior lip of the cervix is grasped with Little tenaculum. Little cervical block is applied using 1% lidocaine at the 2, 4, 8 and 10 o'clock positions of the cervix. The cervix is sounded to 2 cm. The patient has had Little prior supracervical hysterectomy. She has Little narrow transformation zone noted, mostly intracervical. Using the endocervical wire loop electrode, an excision of the endocervical canal is performed in an anterior, posterior, left lateral and right lateral specimen approach. The lining that is left behind is then cauterized with ball Bovie electrocautery. Excellent hemostasis is noted. Sample is sent to pathology for further review, although I am not worried about cervical dysplasia in this particular case. Tenaculum is removed and the patient goes to recovery room in stable condition. All sponge, instrument and needle counts are correct.  ____________________________ R. Annamarie Major, MD rph:jm D: 03/18/2013 11:00:07 ET T: 03/18/2013 13:20:04 ET JOB#: [id number]  cc: Dierdre Searles, MD, <Dictator> Nadara Mustard MD ELECTRONICALLY SIGNED 03/18/2013 15:26

## 2015-04-01 DIAGNOSIS — M549 Dorsalgia, unspecified: Secondary | ICD-10-CM | POA: Insufficient documentation

## 2015-04-01 DIAGNOSIS — F1121 Opioid dependence, in remission: Secondary | ICD-10-CM | POA: Insufficient documentation

## 2015-04-01 DIAGNOSIS — Z8619 Personal history of other infectious and parasitic diseases: Secondary | ICD-10-CM | POA: Insufficient documentation

## 2015-09-07 ENCOUNTER — Encounter: Payer: Self-pay | Admitting: Internal Medicine

## 2016-03-29 ENCOUNTER — Encounter (HOSPITAL_COMMUNITY): Payer: Self-pay | Admitting: Emergency Medicine

## 2016-03-29 ENCOUNTER — Emergency Department (HOSPITAL_COMMUNITY): Payer: Self-pay

## 2016-03-29 ENCOUNTER — Emergency Department (HOSPITAL_COMMUNITY)
Admission: EM | Admit: 2016-03-29 | Discharge: 2016-03-29 | Disposition: A | Discharge status: Home / Self Care | Payer: Self-pay | Attending: Emergency Medicine | Admitting: Emergency Medicine

## 2016-03-29 DIAGNOSIS — Z79899 Other long term (current) drug therapy: Secondary | ICD-10-CM | POA: Insufficient documentation

## 2016-03-29 DIAGNOSIS — Z872 Personal history of diseases of the skin and subcutaneous tissue: Secondary | ICD-10-CM | POA: Insufficient documentation

## 2016-03-29 DIAGNOSIS — H539 Unspecified visual disturbance: Secondary | ICD-10-CM

## 2016-03-29 DIAGNOSIS — Z8719 Personal history of other diseases of the digestive system: Secondary | ICD-10-CM | POA: Insufficient documentation

## 2016-03-29 DIAGNOSIS — R202 Paresthesia of skin: Secondary | ICD-10-CM | POA: Insufficient documentation

## 2016-03-29 DIAGNOSIS — H538 Other visual disturbances: Secondary | ICD-10-CM | POA: Insufficient documentation

## 2016-03-29 DIAGNOSIS — F17210 Nicotine dependence, cigarettes, uncomplicated: Secondary | ICD-10-CM | POA: Insufficient documentation

## 2016-03-29 DIAGNOSIS — R208 Other disturbances of skin sensation: Secondary | ICD-10-CM

## 2016-03-29 DIAGNOSIS — E039 Hypothyroidism, unspecified: Secondary | ICD-10-CM | POA: Insufficient documentation

## 2016-03-29 DIAGNOSIS — Z8659 Personal history of other mental and behavioral disorders: Secondary | ICD-10-CM | POA: Insufficient documentation

## 2016-03-29 DIAGNOSIS — Z862 Personal history of diseases of the blood and blood-forming organs and certain disorders involving the immune mechanism: Secondary | ICD-10-CM | POA: Insufficient documentation

## 2016-03-29 LAB — COMPREHENSIVE METABOLIC PANEL
ALBUMIN: 4 g/dL (ref 3.5–5.0)
ALK PHOS: 65 U/L (ref 38–126)
ALT: 25 U/L (ref 14–54)
AST: 19 U/L (ref 15–41)
Anion gap: 8 (ref 5–15)
BILIRUBIN TOTAL: 0.4 mg/dL (ref 0.3–1.2)
BUN: 13 mg/dL (ref 6–20)
CALCIUM: 8.9 mg/dL (ref 8.9–10.3)
CO2: 21 mmol/L — ABNORMAL LOW (ref 22–32)
Chloride: 108 mmol/L (ref 101–111)
Creatinine, Ser: 0.79 mg/dL (ref 0.44–1.00)
GFR calc Af Amer: >60 mL/min (ref >60)
GLUCOSE: 113 mg/dL — AB (ref 65–99)
Potassium: 3.7 mmol/L (ref 3.5–5.1)
Sodium: 137 mmol/L (ref 135–145)
TOTAL PROTEIN: 7.2 g/dL (ref 6.5–8.1)

## 2016-03-29 LAB — I-STAT CHEM 8, ED
BUN: 15 mg/dL (ref 6–20)
CALCIUM ION: 1.09 mmol/L — AB (ref 1.12–1.23)
CHLORIDE: 108 mmol/L (ref 101–111)
CREATININE: 0.8 mg/dL (ref 0.44–1.00)
GLUCOSE: 106 mg/dL — AB (ref 65–99)
HCT: 43 % (ref 36.0–46.0)
Hemoglobin: 14.6 g/dL (ref 12.0–15.0)
Potassium: 3.7 mmol/L (ref 3.5–5.1)
Sodium: 142 mmol/L (ref 135–145)
TCO2: 21 mmol/L (ref 0–100)

## 2016-03-29 LAB — I-STAT TROPONIN, ED: TROPONIN I, POC: 0 ng/mL (ref 0.00–0.08)

## 2016-03-29 LAB — CBC
HEMATOCRIT: 42.3 % (ref 36.0–46.0)
HEMOGLOBIN: 13.9 g/dL (ref 12.0–15.0)
MCH: 29.6 pg (ref 26.0–34.0)
MCHC: 32.9 g/dL (ref 30.0–36.0)
MCV: 90 fL (ref 78.0–100.0)
Platelets: 301 10*3/uL (ref 150–400)
RBC: 4.7 MIL/uL (ref 3.87–5.11)
RDW: 13.1 % (ref 11.5–15.5)
WBC: 11.9 10*3/uL — ABNORMAL HIGH (ref 4.0–10.5)

## 2016-03-29 LAB — DIFFERENTIAL
BASOS ABS: 0 10*3/uL (ref 0.0–0.1)
Basophils Relative: 0 %
EOS ABS: 0.2 10*3/uL (ref 0.0–0.7)
EOS PCT: 2 %
LYMPHS ABS: 3.1 10*3/uL (ref 0.7–4.0)
LYMPHS PCT: 26 %
MONOS PCT: 9 %
Monocytes Absolute: 1 10*3/uL (ref 0.1–1.0)
NEUTROS PCT: 63 %
Neutro Abs: 7.5 10*3/uL (ref 1.7–7.7)

## 2016-03-29 LAB — APTT: aPTT: 29 seconds (ref 24–37)

## 2016-03-29 LAB — CBG MONITORING, ED: Glucose-Capillary: 107 mg/dL — ABNORMAL HIGH (ref 65–99)

## 2016-03-29 LAB — PROTIME-INR
INR: 0.98 (ref 0.00–1.49)
Prothrombin Time: 13.2 seconds (ref 11.6–15.2)

## 2016-03-29 MED ORDER — GADOBENATE DIMEGLUMINE 529 MG/ML IV SOLN
15.0000 mL | Freq: Once | INTRAVENOUS | Status: AC | PRN
  Administered 2016-03-29: 15 mL via INTRAVENOUS

## 2016-03-29 NOTE — ED Notes (Signed)
Spoke with NP about pt

## 2016-03-29 NOTE — Discharge Instructions (Signed)
Blurred Vision Having blurred vision means that you cannot see things clearly. Your vision may seem fuzzy or out of focus. Blurred vision is a very common symptom of an eye or vision problem. Blurred vision is often a gradual blur that occurs in one eye or both eyes. There are many causes of blurred vision, including cataracts, macular degeneration, and diabetic retinopathy. Blurred vision can be diagnosed based on your symptoms and a physical exam. Tell your health care provider about any other health problems you have, any recent eye injury, and any prior surgeries. You may need to see a health care provider who specializes in eye problems (ophthalmologist). Your treatment depends on what is causing your blurred vision.  HOME CARE INSTRUCTIONS  Tell your health care provider about any changes in your blurred vision.  Do not drive or operate heavy machinery if your vision is blurry.  Keep all follow-up visits as directed by your health care provider. This is important. SEEK MEDICAL CARE IF:  Your symptoms get worse.  You have new symptoms.  You have trouble seeing at night.  You have trouble seeing up close or far away.  You have trouble noticing the difference between colors. SEEK IMMEDIATE MEDICAL CARE IF:  You have severe eye pain.  You have a severe headache.  You have flashing lights in your field of vision.  You have a sudden change in vision.  You have a sudden loss of vision.  You have vision change after an injury.  You notice drainage coming from your eyes.  You notice a rash around your eyes.   This information is not intended to replace advice given to you by your health care provider. Make sure you discuss any questions you have with your health care provider.   Document Released: 10/25/2003 Document Revised: 03/08/2015 Document Reviewed: 09/15/2014 Elsevier Interactive Patient Education 2016 Else[employer] Inc[employer] Paresthesia Paresthesia is an abnormal burning or  prickling sensation. This sensation is generally felt in the hands, arms, legs, or feet. However, it may occur in any part of the body. Usually, it is not painful. The feeling may be described as:  Tingling or numbness.  Pins and needles.  Skin crawling.  Buzzing.  Limbs falling asleep.  Itching. Most people experience temporary (transient) paresthesia at some time in their lives. Paresthesia may occur when you breathe too quickly (hyperventilation). It can also occur without any apparent cause. Commonly, paresthesia occurs when pressure is placed on a nerve. The sensation quickly goes away after the pressure is removed. For some people, however, paresthesia is a long-lasting (chronic) condition that is caused by an underlying disorder. If you continue to have paresthesia, you may need further medical evaluation. HOME CARE INSTRUCTIONS Watch your condition for any changes. Taking the following actions may help to lessen any discomfort that you are feeling:  Avoid drinking alcohol.  Try acupuncture or massage to help relieve your symptoms.  Keep all follow-up visits as directed by your health care provider. This is important. SEEK MEDICAL CARE IF:  You continue to have episodes of paresthesia.  Your burning or prickling feeling gets worse when you walk.  You have pain, cramps, or dizziness.  You develop a rash. SEEK IMMEDIATE MEDICAL CARE IF:  You feel weak.  You have trouble walking or moving.  You have problems with speech, understanding, or vision.  You feel confused.  You cannot control your bladder or bowel movements.  You have numbness after an injury.  You faint.   This  information is not intended to replace advice given to you by your health care provider. Make sure you discuss any questions you have with your health care provider.   Document Released: 10/12/2002 Document Revised: 03/08/2015 Document Reviewed: 10/18/2014 Elsevier Interactive Patient Education  [employer]

## 2016-03-29 NOTE — ED Notes (Signed)
Dr. Kirkpatrick MD at bedside. 

## 2016-03-29 NOTE — Consult Note (Signed)
Requesting Physician: Dr. 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 MRI from that time states no acute infarct however. This AM she noted decreased sensation on her face at 0600 and about 30 minutes later she had blurred vision in her right eye only. She arrived at the ED but was out of the window for tPA. Currently she still has right face, arm, leg and trunk numbness.   Date last known well: Today Time last known well: Time: 06:00 tPA Given: No: out of window   Past Medical History  Diagnosis Date  . Anxiety     H/O episodic Alprazolam use  . Anemia   . Depression     Intolerant of Effexor, Lexapro in the past with SE's; states does not want to take again  . Cholelithiasis 2011    S/P laproscopic cholecystectomy with normal Intra-Op cholangiogram  . Hypothyroidism     H/O Graves Disease; took meds and then became hypothyroid  . Eczema   . Fatigue     with h/o normal overnight pulse oximetry  . Nephrocalcinosis     with h/o normal PTH/Cr/urine calcium  . Abdominal pain     s/p EGD per GI 2011    Past Surgical History  Procedure Laterality Date  . Esophagogastroduodenoscopy  05/25/2010    and [facility] [place] - normal esoph; mild gastritis; normal CBD; GB absent; normal; no biliary stones; bx to check for H. Pylori and celiac sprue  . Cholecystectomy  12/2009  . Cesarean section      Family History  Problem Relation Age of Onset  . Heart disease Mother   . Hyperlipidemia Mother   . Hypertension Mother   . Kidney disease Mother     stones  . Diabetes Father   . Cancer Neg Hx     No colon, breast or ovarian CA   Social History:  reports that she has been smoking  Cigarettes.  She has been smoking about 1.00 pack per day. She does not have any smokeless tobacco history on file. She reports that she drinks alcohol. She reports that she uses illicit drugs (Hydrocodone).  Allergies:  Allergies  Allergen Reactions  . Bupropion Hcl     REACTION: intolerant  . Ibuprofen     intolerant  . Trazodone Hcl     REACTION: intolerant  . Venlafaxine     REACTION: intolerant    Medications:  No current facility-administered medications for this encounter.   Current Outpatient Prescriptions  Medication Sig Dispense Refill  . levothyroxine (SYNTHROID, LEVOTHROID) 88 MCG tablet Take 1 tablet (88 mcg total) by mouth daily. For hypothyroidism 30 tablet 0     ROS:                                                                                                                                       History obtained from the patient  General ROS: negative for - chills, fatigue, fever, night sweats, weight gain or weight loss Psychological ROS: negative for - behavioral disorder, hallucinations, memory difficulties, mood swings or suicidal ideation Ophthalmic ROS: negative for - blurry vision, double vision, eye pain or loss of vision ENT ROS: negative for - epistaxis, nasal discharge, oral lesions, sore throat, tinnitus or vertigo Allergy and Immunology ROS: negative for - hives or itchy/watery eyes Hematological and Lymphatic ROS: negative for - bleeding problems, bruising or swollen lymph nodes Endocrine ROS: negative for - galactorrhea, hair pattern changes, polydipsia/polyuria or temperature intolerance Respiratory ROS: negative for - cough, hemoptysis, shortness of breath or wheezing Cardiovascular ROS: negative for - chest pain, dyspnea on exertion, edema or irregular heartbeat Gastrointestinal ROS: negative for - abdominal pain, diarrhea,  hematemesis, nausea/vomiting or stool incontinence Genito-Urinary ROS: negative for - dysuria, hematuria, incontinence or urinary frequency/urgency Musculoskeletal ROS: negative for - joint swelling or muscular weakness Neurological ROS: as noted in HPI Dermatological ROS: negative for rash and skin lesion changes  Neurologic Examination:                                                                                                      Blood pressure 146/102, pulse 108, temperature 98.2 F (36.8 C), temperature source Oral, resp. rate 18, SpO2 98 %.  HEENT-  Normocephalic, no lesions, without obvious abnormality.  Normal external eye and conjunctiva.  Normal TM's bilaterally.  Normal auditory canals and external ears. Normal external nose, mucus membranes and septum.  Normal pharynx. Cardiovascular- S1, S2 normal, pulses palpable throughout   Lungs- chest clear, no wheezing, rales, normal symmetric air entry Abdomen- normal findings: bowel sounds normal Extremities- no edema Lymph-no adenopathy palpable Musculoskeletal-no joint tenderness, deformity or swelling Skin-warm and dry, no hyperpigmentation, vitiligo, or suspicious lesions  Neurological Examination Mental Status: Alert, oriented, thought content appropriate.  Speech fluent without evidence of aphasia.  Able to follow 3 step commands without difficulty. Cranial Nerves: II: Visual fields grossly normal, pupils equal, round, reactive  to light and accommodation, no APD but states her right eye is lurry III,IV, VI: ptosis not present, extra-ocular motions intact bilaterally V,VII: smile asymmetric on the right (old), facial light touch sensation decreased on the right V1-3 and splits midline with light touch and tuning fork VIII: hearing normal bilaterally IX,X: uvula rises symmetrically XI: bilateral shoulder shrug XII: midline tongue extension Motor: Right : Upper extremity   5/5    Left:     Upper extremity   5/5  Lower  extremity   5/5     Lower extremity   5/5 Tone and bulk:normal tone throughout; no atrophy noted Sensory: Pinprick and light touch decreased on the right also within trunk Deep Tendon Reflexes: 2+ and symmetric throughout Plantars: Right: downgoing   Left: downgoing Cerebellar: normal finger-to-nose, normal rapid alternating movements and normal heel-to-shin test Gait: not tested       Lab Results: Basic Metabolic Panel:  Recent Labs Lab 03/29/16 1248 03/29/16 1303  NA 137 142  K 3.7 3.7  CL 108 108  CO2 21*  --   GLUCOSE 113* 106*  BUN 13 15  CREATININE 0.79 0.80  CALCIUM 8.9  --     Liver Function Tests:  Recent Labs Lab 03/29/16 1248  AST 19  ALT 25  ALKPHOS 65  BILITOT 0.4  PROT 7.2  ALBUMIN 4.0   No results for input(s): LIPASE, AMYLASE in the last 168 hours. No results for input(s): AMMONIA in the last 168 hours.  CBC:  Recent Labs Lab 03/29/16 1248 03/29/16 1303  WBC 11.9*  --   NEUTROABS 7.5  --   HGB 13.9 14.6  HCT 42.3 43.0  MCV 90.0  --   PLT 301  --     Cardiac Enzymes: No results for input(s): CKTOTAL, CKMB, CKMBINDEX, TROPONINI in the last 168 hours.  Lipid Panel: No results for input(s): CHOL, TRIG, HDL, CHOLHDL, VLDL, LDLCALC in the last 168 hours.  CBG:  Recent Labs Lab 03/29/16 1254  GLUCAP 107*    Microbiology: No results found for this or any previous visit.  Coagulation Studies:  Recent Labs  03/29/16 1248  LABPROT 13.2  INR 0.98    Imaging: No results found.     Assessment and plan discussed with with attending physician and they are in agreement.    Katie Morn PA-C [facility] [phone]  03/29/2016, 1:29 PM   Assessment: 40 y.o. female with a history of episode of right-sided facial weakness in 2014 with negative MRI of the time. She had blurred vision improving over the course of the day as well as transient right-sided paresthesia. The description of the event does not sound  vascular in nature. It is possible that she had an atypical migraine variant without headache. I do not think that multiple sclerosis is all likely given her description of the symptoms and the rate of improvement.  I do think an MRI would be reasonable, however if this is negative and I would not perform any further workup at this time but have her follow-up with ophthalmology and neurology.  Stroke Risk Factors - none   1) MRI brain 2) if negative no further workup at this time  Ritta Slot, MD [facility] [phone]  If 7pm- 7am, please page neurology on call as listed in AMION.

## 2016-03-29 NOTE — ED Provider Notes (Signed)
CSN: [id number]     Arrival date & time 03/29/16  1240 History   First MD Initiated Contact with Patient 03/29/16 1256     Chief Complaint  Patient presents with  . Numbness  . Blurred Vision     (Consider location/radiation/quality/duration/timing/severity/associated sxs/prior Treatment) HPI Comments: The patient presents with complaint of right facial tingling that extends into right upper extremity without weakness. No symptoms in the lower extremity. She had blurred vision limited to the right eye as well. Symptoms started around 6:00 am this morning and continued through the day, getting gradually better over time. Currently the tingling is gone and the blurry vision is minimal. She has a history of migraine headache with variant symptoms of visual change, but states current visual change is not similar to those she associates with her migraine headaches. She denies pain at any time today. No nausea or vomiting.   The history is provided by the patient. No language interpreter was used.    Past Medical History  Diagnosis Date  . Anxiety     H/O episodic Alprazolam use  . Anemia   . Depression     Intolerant of Effexor, Lexapro in the past with SE's; states does not want to take again  . Cholelithiasis 2011    S/P laproscopic cholecystectomy with normal Intra-Op cholangiogram  . Hypothyroidism     H/O Graves Disease; took meds and then became hypothyroid  . Eczema   . Fatigue     with h/o normal overnight pulse oximetry  . Nephrocalcinosis     with h/o normal PTH/Cr/urine calcium  . Abdominal pain     s/p EGD per GI 2011   Past Surgical History  Procedure Laterality Date  . Esophagogastroduodenoscopy  05/25/2010    and Endo [place] - normal esoph; mild gastritis; normal CBD; GB absent; normal; no biliary stones; bx to check for H. Pylori and celiac sprue  . Cholecystectomy  12/2009  . Cesarean section     Family History  Problem Relation Age of Onset  . Heart disease Mother    . Hyperlipidemia Mother   . Hypertension Mother   . Kidney disease Mother     stones  . Diabetes Father   . Cancer Neg Hx     No colon, breast or ovarian CA   Social History  Substance Use Topics  . Smoking status: Current Every Day Smoker -- 1.00 packs/day    Types: Cigarettes  . Smokeless tobacco: None  . Alcohol Use: Yes     Comment: Vicodin   OB History    No data available     Review of Systems  Constitutional: Negative for fever and chills.  HENT: Negative.   Eyes: Positive for visual disturbance.  Respiratory: Negative.  Negative for shortness of breath.   Cardiovascular: Negative.  Negative for chest pain.  Gastrointestinal: Negative.   Musculoskeletal: Negative.   Skin: Negative.   Neurological: Negative for dizziness and headaches.       See HPI.      Allergies  Bupropion hcl; Ibuprofen; Trazodone hcl; and Venlafaxine  Home Medications   Prior to Admission medications   Medication Sig Start Date End Date Taking? Authorizing Provider  levothyroxine (SYNTHROID, LEVOTHROID) 100 MCG tablet Take 100 mcg by mouth daily before breakfast.   Yes Historical Provider, MD  levothyroxine (SYNTHROID, LEVOTHROID) 88 MCG tablet Take 1 tablet (88 mcg total) by mouth daily. For hypothyroidism Patient not taking: Reported on 03/29/2016 07/22/12   Lupe Carney, DO  BP 122/84 mmHg  Pulse 76  Temp(Src) 98.2 F (36.8 C) (Oral)  Resp 23  SpO2 95%  LMP 06/24/2012 Physical Exam  Constitutional: She is oriented to person, place, and time. She appears well-developed and well-nourished.  HENT:  Head: Normocephalic.  Neck: Normal range of motion. Neck supple.  Cardiovascular: Normal rate and regular rhythm.   Pulmonary/Chest: Effort normal and breath sounds normal.  Abdominal: Soft. Bowel sounds are normal. There is no tenderness. There is no rebound and no guarding.  Musculoskeletal: Normal range of motion.  Neurological: She is alert and oriented to person,  place, and time. Coordination normal.  No facial asymmetry. CN's 2-12 grossly intact. No lateralizing weakness. No deficits of coordination. Speech clear, focused and organized.  Skin: Skin is warm and dry. No rash noted.  Psychiatric: She has a normal mood and affect.    ED Course  Procedures (including critical care time) Labs Review Labs Reviewed  CBC - Abnormal; Notable for the following:    WBC 11.9 (*)    All other components within normal limits  COMPREHENSIVE METABOLIC PANEL - Abnormal; Notable for the following:    CO2 21 (*)    Glucose, Bld 113 (*)    All other components within normal limits  CBG MONITORING, ED - Abnormal; Notable for the following:    Glucose-Capillary 107 (*)    All other components within normal limits  I-STAT CHEM 8, ED - Abnormal; Notable for the following:    Glucose, Bld 106 (*)    Calcium, Ion 1.09 (*)    All other components within normal limits  PROTIME-INR  APTT  DIFFERENTIAL  I-STAT TROPOININ, ED    Imaging Review Ct Head Wo Contrast  03/29/2016  CLINICAL DATA:  Blurred vision, right side numbness starting 6:30 a.m., symptoms resolved EXAM: CT HEAD WITHOUT CONTRAST TECHNIQUE: Contiguous axial images were obtained from the base of the skull through the vertex without intravenous contrast. COMPARISON:  Brain MRI 05/09/2013 FINDINGS: Brain: No intracranial hemorrhage, mass effect or midline shift. No acute cortical infarction. No mass lesion is noted on this unenhanced scan. No intra or extra-axial fluid collection. No hydrocephalus. Vascular: No hyperdense vessel or unexpected calcification.She Skull: Negative for fracture or focal lesion. Sinuses/Orbits: No acute findings. Other: None. IMPRESSION: No acute intracranial abnormality. Electronically Signed   By: Natasha Mead M.D.   On: 03/29/2016 14:19   I have personally reviewed and evaluated these images and lab results as part of my medical decision-making.   EKG Interpretation   Date/Time:   Thursday Mar 29 2016 12:50:47 EDT Ventricular Rate:  102 PR Interval:  150 QRS Duration: 70 QT Interval:  354 QTC Calculation: 461 R Axis:   68 Text Interpretation:  Sinus tachycardia Otherwise normal ECG No  significant change since last tracing Confirmed by Anitra Lauth  MD, Alphonzo Lemmings  [phone]) on 03/29/2016 1:52:16 PM      MDM   Final diagnoses:  Decreased sensation  Blurred vision, left eye    Patient is seen and evaluated by neurology, Dr. Amada Jupiter. Doubt acute event. Recommends MR brain and, if negative, discharge home with ophthalmology follow up. If positive - admit.  Patient care signed out to Dr. Jerelyn Scott pending MR and disposition.     Elpidio Anis, PA-C 03/29/16 1544  Gwyneth Sprout, MD 03/31/16 [phone]

## 2016-03-29 NOTE — ED Notes (Signed)
Patient transported to CT 

## 2016-03-29 NOTE — ED Notes (Signed)
Dr. Linker MD at bedside. 

## 2016-03-29 NOTE — ED Notes (Signed)
Pt sts some blurred vision and right sided numbness starting at 0630 this am; pt sts most sx have now resolved but still having some blurred vision; no obvious neuro deficits noted at present

## 2016-04-03 ENCOUNTER — Encounter: Payer: Self-pay | Admitting: Neurology

## 2016-04-03 ENCOUNTER — Ambulatory Visit (INDEPENDENT_AMBULATORY_CARE_PROVIDER_SITE_OTHER): Payer: Self-pay | Admitting: Neurology

## 2016-04-03 VITALS — BP 114/75 | HR 93 | Ht 62.0 in | Wt 166.6 lb

## 2016-04-03 DIAGNOSIS — G43809 Other migraine, not intractable, without status migrainosus: Secondary | ICD-10-CM

## 2016-04-03 DIAGNOSIS — G43109 Migraine with aura, not intractable, without status migrainosus: Secondary | ICD-10-CM

## 2016-04-03 NOTE — Progress Notes (Signed)
[facility]    Provider:  Dr Lucia Gaskins Referring Provider: Joaquim Nam, MD Primary Care Physician:  Crawford Givens MD  CC:  Vision loss  HPI:  Katie Little is a 40 y.o. female here as a referral from Dr. Para March for visual deficits and facial droop and sensory changes. She has a PMHx of migraine, depression, hypothyroidism, fatigue. The first episode happened 3 years ago in 2014 without any inciting events,  she had vision changes mild in the right eye again slowly, she had tingling in the right side of the face and tingling in the right hand. Lasted a few minutes.  she has had right eye blurry vision or bilateral blurry vision. This last episode included numbness and tingling on the right side of the face and lasted 20-30 minutes not in association with a headache. She gets bad headaches sometimes, she has had an aura in the right eye. She has no warning with the episodes. A year ago she woke up with blurry vision and lasted 3-4 hours, both eyes. She was checked out by an optometrist in the past, she recently saw Dr. Dione Booze and everything is fine. She smokes heavily and is obese. She doesn't have headaches often. This is the third time. This last episode, she could see out of the left eye fine. The blurry vision came on slowly in the right and she had time to pull over safely. 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 Nothing makes the episodes better or worse, she just waits it out and it resolves. Usually without a headache. No focal neurologic deficits at baseline. She was supposed to be on a baby aspirin, she is non compliant. She smokes heavily.   Reviewed notes, labs and imaging from outside physicians, which showed:  MRI of the brain : Several small lesions in the left frontal white  matter. At least 1 of these was present in 2014. These are nonspecific and could be seen with demyelinating disease, chronic ischemia, migraine headache, and cerebral inflammatory disorders.  Patient seen in the ED 03/29/2016: Reviewed notes. 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 MRI from that time did not show acute infarct however. In the morning AM she noted decreased sensation on her face at 0600 and about 30 minutes later she had blurred vision in her right eye only. She arrived at the ED but was out of the window for tPA. She still has right face, arm, leg and trunk numbness when seen in the ED. MRi showed no acute abnormality.  CBC unremarkable except elevated WBCs 11.9, CMP also unremarkable 03/29/2016 Review of Systems: Patient complains of symptoms per HPI as well as the following symptoms: no CP, no SOB. Pertinent negatives per HPI. All others negative.   Social History   Social History  . Marital Status: Single    Spouse Name: N/A  . Number of Children: 1  . Years of Education: some colle   Occupational History  . Truck driver    Social History Main Topics  . Smoking status: Current Every Day Smoker -- 1.00 packs/day    Types: Cigarettes  . Smokeless tobacco: Not on file  . Alcohol Use: Yes     Comment: Vicodin  .  Drug Use: Yes    Special: Hydrocodone     Comment: opiate use  . Sexual Activity: Yes    Birth Control/ Protection: None   Other Topics Concern  . Not on file   Social History Narrative   Lives alone   Caffeine use: 1-2 drinks per day    Family History  Problem Relation Age of Onset  . Heart disease Mother   . Hyperlipidemia Mother   . Hypertension Mother   . Kidney disease Mother     stones  . Migraines Mother   . Diabetes Father   . Cancer Neg Hx     No colon, breast or ovarian CA    Past Medical History  Diagnosis Date  . Anxiety     H/O episodic Alprazolam use  . Anemia   . Depression      Intolerant of Effexor, Lexapro in the past with SE's; states does not want to take again  . Cholelithiasis 2011    S/P laproscopic cholecystectomy with normal Intra-Op cholangiogram  . Hypothyroidism     H/O Graves Disease; took meds and then became hypothyroid  . Eczema   . Fatigue     with h/o normal overnight pulse oximetry  . Nephrocalcinosis     with h/o normal PTH/Cr/urine calcium  . Abdominal pain     s/p EGD per GI 2011    Past Surgical History  Procedure Laterality Date  . Esophagogastroduodenoscopy  05/25/2010    and Endo [place] - normal esoph; mild gastritis; normal CBD; GB absent; normal; no biliary stones; bx to check for H. Pylori and celiac sprue  . Cholecystectomy  12/2009  . Cesarean section    . Vaginal hysterectomy  2013    Current Outpatient Prescriptions  Medication Sig Dispense Refill  . clobetasol cream (TEMOVATE) 0.05 % Apply 1 application topically 2 (two) times daily.    . fluticasone (FLONASE) 50 MCG/ACT nasal spray Place 2 sprays into both nostrils daily.    Marland Kitchen levothyroxine (SYNTHROID, LEVOTHROID) 100 MCG tablet Take 100 mcg by mouth daily before breakfast.     No current facility-administered medications for this visit.    Allergies as of 04/03/2016 - Review Complete 04/03/2016  Allergen Reaction Noted  . Nsaids Other (See Comments) 04/03/2016  . Tramadol Other (See Comments) 04/03/2016  . Bupropion hcl  01/05/2011  . Doxycycline Nausea And Vomiting 04/03/2016  . Ibuprofen  07/19/2012  . Trazodone hcl  01/05/2011  . Venlafaxine  01/05/2011  . Other Anxiety 04/03/2016    Vitals: BP 114/75 mmHg  Pulse 93  Ht  (1.575 m)  Wt 166 lb 9.6 oz (75.569 kg)  BMI 30.46 kg/m2  LMP 06/24/2012 Last Weight:  Wt Readings from Last 1 Encounters:  04/03/16 166 lb 9.6 oz (75.569 kg)   Last Height:   Ht Readings from Last 1 Encounters:  04/03/16  (1.575 m)   Physical exam: Exam: Gen: NAD, conversant, well nourised, obese, well groomed                      CV: RRR, no MRG. No Carotid Bruits. No peripheral edema, warm, nontender Eyes: Conjunctivae clear without exudates or hemorrhage  Neuro: Detailed Neurologic Exam  Speech:    Speech is normal; fluent and spontaneous with normal comprehension.  Cognition:    The patient is oriented to person, place, and time;     recent and remote memory intact;     language fluent;  normal attention, concentration,     fund of knowledge Cranial Nerves:    The pupils are equal, round, and reactive to light. The fundi are normal and spontaneous venous pulsations are present. Visual fields are full to finger confrontation. Extraocular movements are intact. Trigeminal sensation is intact and the muscles of mastication are normal. The face is symmetric. The palate elevates in the midline. Hearing intact. Voice is normal. Shoulder shrug is normal. The tongue has normal motion without fasciculations.   Coordination:    Normal finger to nose and heel to shin. Normal rapid alternating movements.   Gait:    Heel-toe and tandem gait are normal.   Motor Observation:    No asymmetry, no atrophy, and no involuntary movements noted. Tone:    Normal muscle tone.    Posture:    Posture is normal. normal erect    Strength:    Strength is V/V in the upper and lower limbs.      Sensation: intact to LT     Reflex Exam:  DTR's:    Deep tendon reflexes in the upper and lower extremities are normal bilaterally.   Toes:    The toes are downgoing bilaterally.   Clonus:    Clonus is absent.       Assessment/Plan:  40 y.o. female with a PMHx of migraines and migraine aura here for multiple episodes of paresthesias and blurry vision, slowly progressive, she has enough time if driving to get to a safe space, no altered awareness or LOC. 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 Happened 3x in the last 3 years, advised  once she has any of the auras to pull over or get to a safe place asap. No preventative needed. Advised she needs to stop smoking, lose weight.  CC: Dr. Leonia Reader, MD  [facility] [address] [place], [place] [address]  Phone [phone] Fax [phone]

## 2016-04-03 NOTE — Patient Instructions (Signed)
Remember to drink plenty of fluid, eat healthy meals and do not skip any meals. Try to eat protein with a every meal and eat a healthy snack such as fruit or nuts in between meals. Try to keep a regular sleep-wake schedule and try to exercise daily, particularly in the form of walking, 20-30 minutes a day, if you can.   As far as your medications are concerned, I would like to suggest:none  As far as diagnostic testing: none  I would like to see you back as needed, sooner if we need to. Please call us with any interim questions, concerns, problems, updates or refill requests.   Our phone number is [phone]. We also have an after hours call service for urgent matters and there is a physician on-call for urgent questions. For any emergencies you know to call 911 or go to the nearest emergency room

## 2016-05-15 DIAGNOSIS — R5383 Other fatigue: Secondary | ICD-10-CM | POA: Insufficient documentation

## 2016-05-15 DIAGNOSIS — R110 Nausea: Secondary | ICD-10-CM | POA: Insufficient documentation

## 2016-07-10 MED FILL — ONDANSETRON HCL 4 MG TABLET: 4 | 30 days supply | Qty: 30 | Fill #0

## 2017-08-08 DIAGNOSIS — E063 Autoimmune thyroiditis: Secondary | ICD-10-CM | POA: Insufficient documentation

## 2017-10-30 ENCOUNTER — Other Ambulatory Visit: Payer: Self-pay | Admitting: Internal Medicine

## 2017-10-30 DIAGNOSIS — L2084 Intrinsic (allergic) eczema: Secondary | ICD-10-CM | POA: Insufficient documentation

## 2017-10-30 DIAGNOSIS — M79662 Pain in left lower leg: Secondary | ICD-10-CM

## 2017-10-30 DIAGNOSIS — E038 Other specified hypothyroidism: Secondary | ICD-10-CM

## 2017-10-30 DIAGNOSIS — E559 Vitamin D deficiency, unspecified: Secondary | ICD-10-CM | POA: Insufficient documentation

## 2017-10-31 ENCOUNTER — Ambulatory Visit
Admission: RE | Admit: 2017-10-31 | Discharge: 2017-10-31 | Disposition: A | Discharge status: Home / Self Care | Payer: Self-pay | Source: Ambulatory Visit | Attending: Internal Medicine | Admitting: Internal Medicine

## 2017-10-31 DIAGNOSIS — E038 Other specified hypothyroidism: Secondary | ICD-10-CM

## 2017-10-31 DIAGNOSIS — I82442 Acute embolism and thrombosis of left tibial vein: Secondary | ICD-10-CM | POA: Insufficient documentation

## 2017-10-31 DIAGNOSIS — M79662 Pain in left lower leg: Secondary | ICD-10-CM

## 2018-03-19 ENCOUNTER — Emergency Department
Admission: EM | Admit: 2018-03-19 | Discharge: 2018-03-19 | Disposition: A | Discharge status: Home / Self Care | Payer: Worker's Compensation | Attending: Emergency Medicine | Admitting: Emergency Medicine

## 2018-03-19 ENCOUNTER — Emergency Department: Payer: Worker's Compensation

## 2018-03-19 ENCOUNTER — Other Ambulatory Visit: Payer: Self-pay

## 2018-03-19 DIAGNOSIS — X500XXA Overexertion from strenuous movement or load, initial encounter: Secondary | ICD-10-CM | POA: Diagnosis not present

## 2018-03-19 DIAGNOSIS — Z79899 Other long term (current) drug therapy: Secondary | ICD-10-CM | POA: Insufficient documentation

## 2018-03-19 DIAGNOSIS — Y929 Unspecified place or not applicable: Secondary | ICD-10-CM | POA: Diagnosis not present

## 2018-03-19 DIAGNOSIS — S86111A Strain of other muscle(s) and tendon(s) of posterior muscle group at lower leg level, right leg, initial encounter: Secondary | ICD-10-CM | POA: Diagnosis not present

## 2018-03-19 DIAGNOSIS — Y999 Unspecified external cause status: Secondary | ICD-10-CM | POA: Insufficient documentation

## 2018-03-19 DIAGNOSIS — Y9389 Activity, other specified: Secondary | ICD-10-CM | POA: Insufficient documentation

## 2018-03-19 DIAGNOSIS — F17210 Nicotine dependence, cigarettes, uncomplicated: Secondary | ICD-10-CM | POA: Diagnosis not present

## 2018-03-19 DIAGNOSIS — E039 Hypothyroidism, unspecified: Secondary | ICD-10-CM | POA: Diagnosis not present

## 2018-03-19 DIAGNOSIS — S8991XA Unspecified injury of right lower leg, initial encounter: Secondary | ICD-10-CM | POA: Diagnosis present

## 2018-03-19 MED ORDER — OXYCODONE-ACETAMINOPHEN 5-325 MG PO TABS
1.0000 | ORAL_TABLET | Freq: Once | ORAL | Status: AC
  Administered 2018-03-19: 1 via ORAL

## 2018-03-19 MED ORDER — HYDROCODONE-ACETAMINOPHEN 5-325 MG PO TABS
1.0000 | ORAL_TABLET | ORAL | 0 refills | Status: AC | PRN
Start: 2018-03-19 — End: 2018-03-24

## 2018-03-19 MED ORDER — OXYCODONE-ACETAMINOPHEN 5-325 MG PO TABS
ORAL_TABLET | ORAL | Status: AC
  Administered 2018-03-19: 1 via ORAL
  Filled 2018-03-19: qty 1

## 2018-03-19 NOTE — ED Provider Notes (Signed)
[facility] Emergency Department Provider Note ____________________________________________   First MD Initiated Contact with Patient 03/19/18 2012     (approximate)  I have reviewed the triage vital signs and the nursing notes.   HISTORY  Chief Complaint Tendonitis    HPI Katie Little is a 42 y.o. female with PMH as noted below who presents with right calf or ankle injury, acute onset approximately 10 hours ago when the patient stepped off a high step on her truck onto the ball of her foot, and felt a sudden pop in her posterior lower leg with a shooting pain up the calf.  Patient states that she has had difficulty bearing weight on the right leg since that time and difficulty moving her foot.  No weakness or numbness.  No other injuries.  Patient was on Eliquis for 3 months after DVT in the other leg, which finished 2 weeks ago.  Past Medical History:  Diagnosis Date  . Abdominal pain    s/p EGD per GI 2011  . Anemia   . Anxiety    H/O episodic Alprazolam use  . Cholelithiasis 2011   S/P laproscopic cholecystectomy with normal Intra-Op cholangiogram  . Depression    Intolerant of Effexor, Lexapro in the past with SE's; states does not want to take again  . Eczema   . Fatigue    with h/o normal overnight pulse oximetry  . Hypothyroidism    H/O Graves Disease; took meds and then became hypothyroid  . Nephrocalcinosis    with h/o normal PTH/Cr/urine calcium    Patient Active Problem List   Diagnosis Date Noted  . 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 Opiate dependence, continuous (HCC) 07/19/2012  . Menorrhagia 03/04/2012  . Chest pain 03/01/2011  . Smoker 03/01/2011  . Abdominal pain 02/14/2011  . FATIGUE 07/06/2010  . HERPES LABIALIS 04/20/2010  . TENDINITIS, LEFT WRIST 04/20/2010  . NEPHROCALCINOSIS 03/14/2010  . HYPOTHYROIDISM 02/16/2010  . ANXIETY 02/16/2010  . DEPRESSION 02/16/2010  . CHOLELITHIASIS 02/16/2010  . ECZEMA 02/16/2010  .  CHOLECYSTECTOMY, LAPAROSCOPIC, HX OF 02/16/2010    Past Surgical History:  Procedure Laterality Date  . CESAREAN SECTION    . CHOLECYSTECTOMY  12/2009  . ESOPHAGOGASTRODUODENOSCOPY  05/25/2010   and Endo [place] - normal esoph; mild gastritis; normal CBD; GB absent; normal; no biliary stones; bx to check for H. Pylori and celiac sprue  . VAGINAL HYSTERECTOMY  2013    Prior to Admission medications   Medication Sig Start Date End Date Taking? Authorizing Provider  clobetasol cream (TEMOVATE) 0.05 % Apply 1 application topically 2 (two) times daily. 02/16/16   [provider]  fluticasone (FLONASE) 50 MCG/ACT nasal spray Place 2 sprays into both nostrils daily.    [provider]  HYDROcodone-acetaminophen (NORCO/VICODIN) 5-325 MG tablet Take 1 tablet by mouth every 4 (four) hours as needed for up to 5 days for severe pain. 03/19/18 03/24/18  Dionne Bucy, MD  levothyroxine (SYNTHROID, LEVOTHROID) 100 MCG tablet Take 100 mcg by mouth daily before breakfast.    [provider]    Allergies Nsaids; Tramadol; Bupropion hcl; Doxycycline; Ibuprofen; Trazodone hcl; Venlafaxine; and Other  Family History  Problem Relation Age of Onset  . Heart disease Mother   . Hyperlipidemia Mother   . Hypertension Mother   . Kidney disease Mother        stones  . Migraines Mother   . Diabetes Father   . Cancer Neg Hx  No colon, breast or ovarian CA    Social History Social History   Tobacco Use  . Smoking status: Current Every Day Smoker    Packs/day: 1.00    Types: Cigarettes  Substance Use Topics  . Alcohol use: Yes    Comment: Vicodin  . Drug use: Yes    Types: Hydrocodone    Comment: opiate use    Review of Systems  Constitutional: No fever. Eyes: No redness. ENT: No neck pain. Cardiovascular: Denies chest pain. Respiratory: Denies shortness of breath. Gastrointestinal: No abdominal pain.  Genitourinary: Negative for flank pain.  Musculoskeletal:  Positive for leg injury. Skin: Negative for rash. Neurological: Negative for focal weakness or numbness.   ____________________________________________   PHYSICAL EXAM:  VITAL SIGNS: ED Triage Vitals [03/19/18 1721]  Enc Vitals Group     BP (!) 141/76     Pulse Rate 94     Resp 18     Temp 98.4 F (36.9 C)     Temp Source Oral     SpO2 98 %     Weight 190 lb (86.2 kg)     Height 5\' 2"  (1.575 m)     Head Circumference      Peak Flow      Pain Score 8     Pain Loc      Pain Edu?      Excl. in GC?     Constitutional: Alert and oriented.  Uncomfortable appearing but in no acute distress. Eyes: Conjunctivae are normal.  Head: Atraumatic. Nose: No congestion/rhinnorhea. Mouth/Throat: Mucous membranes are moist.   Neck: Normal range of motion.  Cardiovascular:   Good peripheral circulation. Respiratory: Normal respiratory effort.   Gastrointestinal:No distention.  Musculoskeletal: Extremities warm and well perfused.  Tenderness to posterior right calf.  Achilles minimally tender.  No palpable defect.  Patient able to plantarflex minimally.  Severe pain on attempted dorsiflexion.  2+ DP pulse.  Intact distal motor and fine touch. Neurologic:  Normal speech and language. No gross focal neurologic deficits are appreciated.  Skin:  Skin is warm and dry. No rash noted. Psychiatric: Mood and affect are normal. Speech and behavior are normal.  ____________________________________________   LABS (all labs ordered are listed, but only abnormal results are displayed)  Labs Reviewed - No data to display ____________________________________________  EKG   ____________________________________________  RADIOLOGY  XR right tib-fib and ankle: No acute fracture  ____________________________________________   PROCEDURES  Procedure(s) performed: No  Procedures  Critical Care performed: No ____________________________________________   INITIAL IMPRESSION / ASSESSMENT  AND PLAN / ED COURSE  Pertinent labs & imaging results that were available during my care of the patient were reviewed by me and considered in my medical decision making (see chart for details).  42 year old female presents with right lower leg injury after stepping off a high step earlier today, with subsequent difficulty bearing weight on the right leg.  On exam, the right lower extremity is neuro/vascular intact.  There is tenderness to the posterior calf, minimal tenderness to the Achilles, no deformity, no bony tenderness.  The remainder the exam is as described above.  Overall I suspect most likely gastrocnemius tear or strain, however given the difficulty bearing weight and the description of the episode, Achilles tendinopathy or tear is also highly likely.  No evidence of DVT given the acute onset of the pain and the specific onset related to her trauma.  Plan: Analgesia, x-ray, and reassess.  Plan for likely splinting in case this  is an Achilles tear, with orthopedic follow-up.    ----------------------------------------- 10:31 PM on 03/19/2018 -----------------------------------------  X-rays show no acute findings.  Patient's pain is improved.  Splint for possible Achilles tear placed, and patient counseled on the follow-up plan.  She has been given orthopedic referral.  Return precautions given, the patient expresses understanding.   ____________________________________________   FINAL CLINICAL IMPRESSION(S) / ED DIAGNOSES  Final diagnoses:  Rupture of right gastrocnemius tendon, initial encounter      NEW MEDICATIONS STARTED DURING THIS VISIT:  New Prescriptions   HYDROCODONE-ACETAMINOPHEN (NORCO/VICODIN) 5-325 MG TABLET    Take 1 tablet by mouth every 4 (four) hours as needed for up to 5 days for severe pain.     Note:  This document was prepared using Dragon voice recognition software and may include unintentional dictation errors.    Dionne Bucy,  MD 03/19/18 2232

## 2018-03-19 NOTE — Discharge Instructions (Signed)
You likely have a tear of the gastrocnemius (the calf muscle), however based on exam and you we cannot fully be sure that you did not tear the Achilles tendon.  Keep the splint on and do not put any weight on the right leg until you follow-up with orthopedics.  Keep the leg elevated.  Call Dr. Eliane Decree office tomorrow to arrange for follow-up within the next week.  Return to the ER for new, worsening, persistent severe pain, weakness or numbness, chest pain or difficulty breathing, or any other new or worsening symptoms that concern you.

## 2018-03-19 NOTE — ED Triage Notes (Addendum)
Pt states that she planted her right foot and felt something in the back of her ankle pop and felt something radiate up the back of her  Calf, pt states that if she attempts to put weight on the foot is causes excruciating pain. Pt reports that she just competed eliquis 2 weeks ago for blood clot to the left calf and possible PE

## 2018-04-18 ENCOUNTER — Other Ambulatory Visit: Payer: Self-pay | Admitting: Orthopedic Surgery

## 2018-04-18 DIAGNOSIS — S86111D Strain of other muscle(s) and tendon(s) of posterior muscle group at lower leg level, right leg, subsequent encounter: Secondary | ICD-10-CM

## 2018-04-30 ENCOUNTER — Ambulatory Visit
Admission: RE | Admit: 2018-04-30 | Discharge: 2018-04-30 | Disposition: A | Discharge status: Home / Self Care | Payer: Worker's Compensation | Source: Ambulatory Visit | Attending: Orthopedic Surgery | Admitting: Orthopedic Surgery

## 2018-04-30 DIAGNOSIS — S86111D Strain of other muscle(s) and tendon(s) of posterior muscle group at lower leg level, right leg, subsequent encounter: Secondary | ICD-10-CM | POA: Insufficient documentation

## 2018-04-30 DIAGNOSIS — X58XXXD Exposure to other specified factors, subsequent encounter: Secondary | ICD-10-CM | POA: Insufficient documentation

## 2018-07-02 DIAGNOSIS — E66811 Obesity, class 1: Secondary | ICD-10-CM | POA: Insufficient documentation

## 2018-08-23 IMAGING — MR MR [PERSON_NAME] LOW W/O CM*R*
6 of 7 series · 32 of 40 positions shown · non-contrast
Comparison: Right tibia and fibula x-rays dated March 19, 2018.

CLINICAL DATA: Right calf pain and swelling after feeling a pop
when stepping out of a car 1 month ago.

EXAM:
MRI OF LOWER RIGHT EXTREMITY WITHOUT CONTRAST
TECHNIQUE: Multiplanar, multisequence MR imaging of the right tibia and fibula
was performed. No intravenous contrast was administered.

[Series 4: T1 · axial · 5.0mm · 1.09mm/px · z∈[-65,+160]mm · 7 of 31 slices shown (1 of 3)]
[im 1/31]
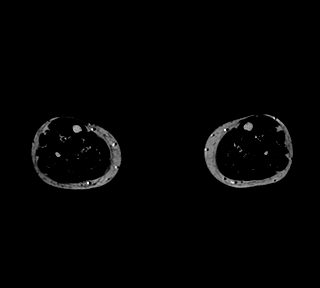
[im 6/31]
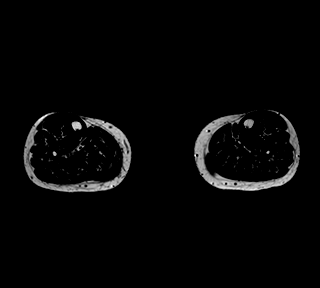
[im 11/31]
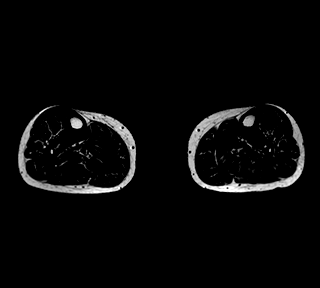
[im 16/31]
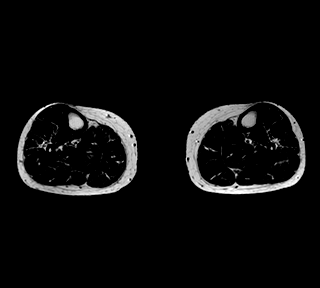
[im 21/31]
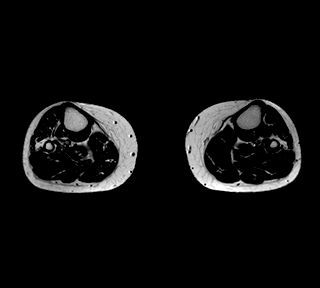
[im 26/31]
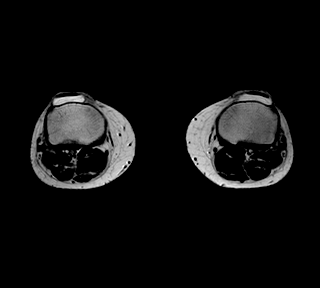
[im 31/31]
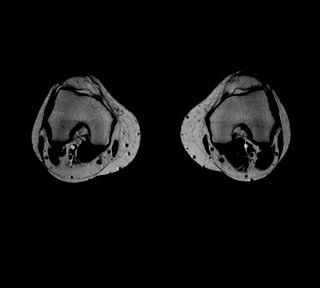

[Series 5: T1 · axial · 5.0mm · 1.09mm/px · z∈[-229,-41]mm · 6 of 26 slices shown (2 of 3)]
[im 1/26]
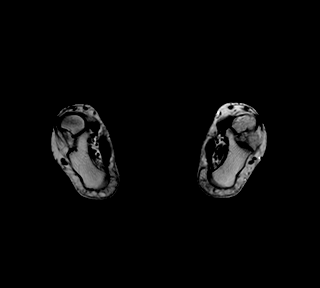
[im 6/26]
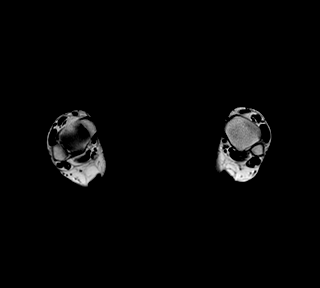
[im 11/26]
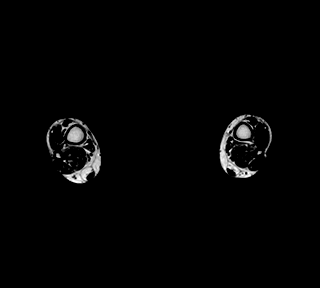
[im 16/26]
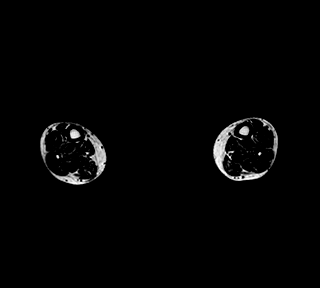
[im 21/26]
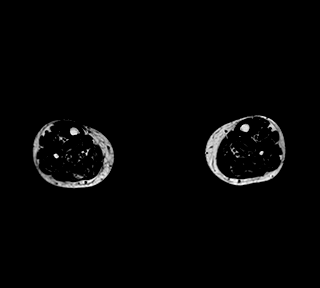
[im 26/26]
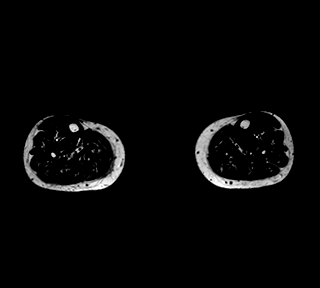

[Series 6: T2 fat-sat · axial · 5.0mm · 0.68mm/px · z∈[-65,+160]mm · 7 of 31 slices shown (1 of 3)]
[im 1/31]
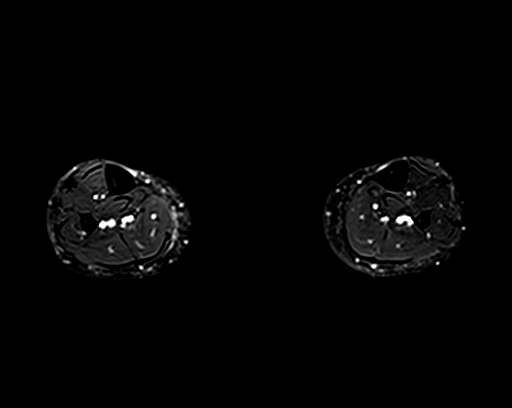
[im 6/31]
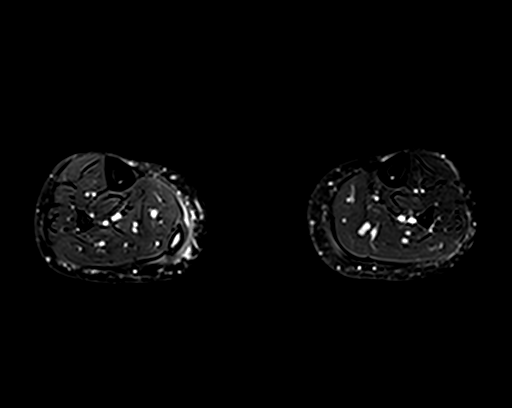
[im 11/31]
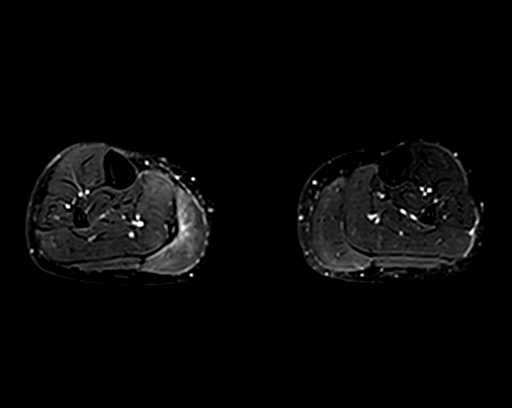
[im 16/31]
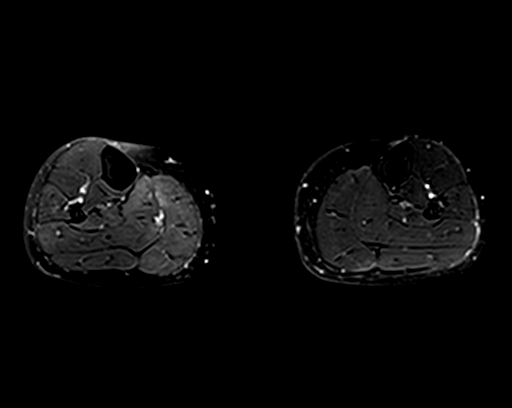
[im 21/31]
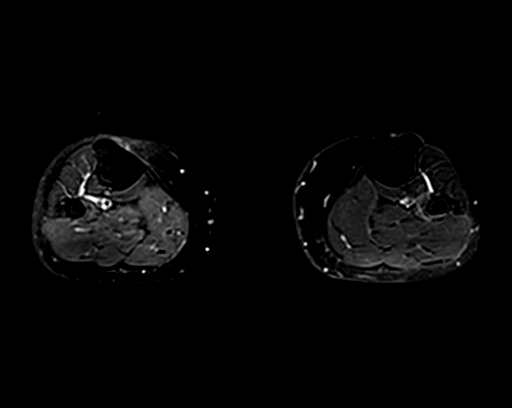
[im 26/31]
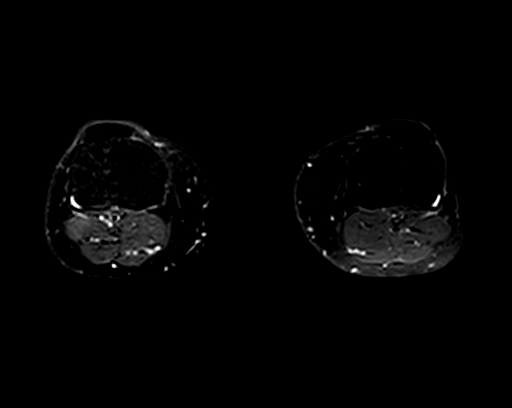
[im 31/31]
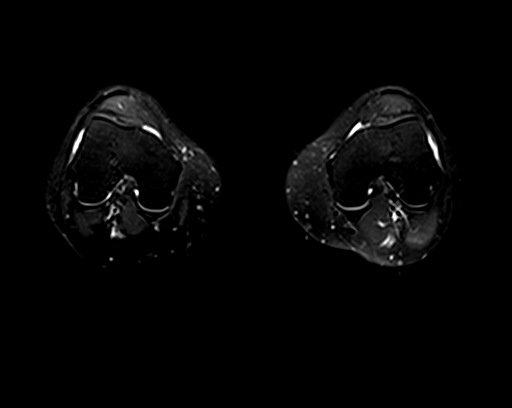

[Series 7: T2 fat-sat · axial · 5.0mm · 0.74mm/px · z∈[-229,-41]mm · 6 of 26 slices shown (2 of 3)]
[im 1/26]
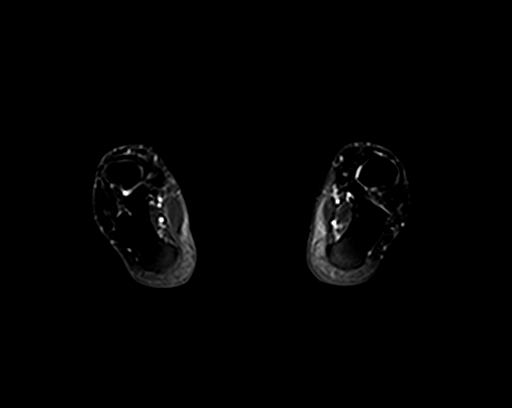
[im 6/26]
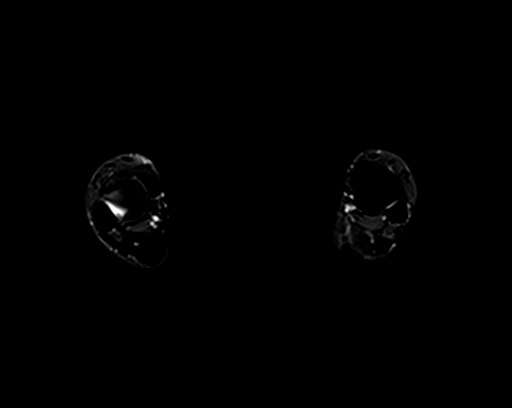
[im 11/26]
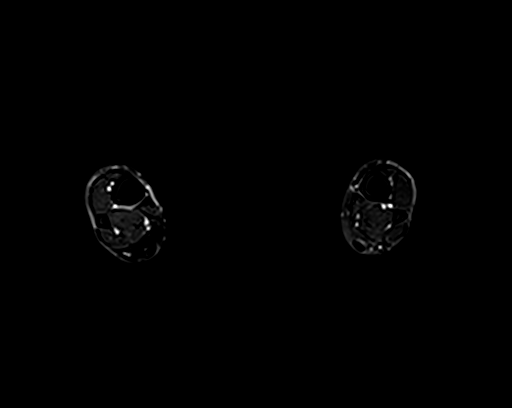
[im 16/26]
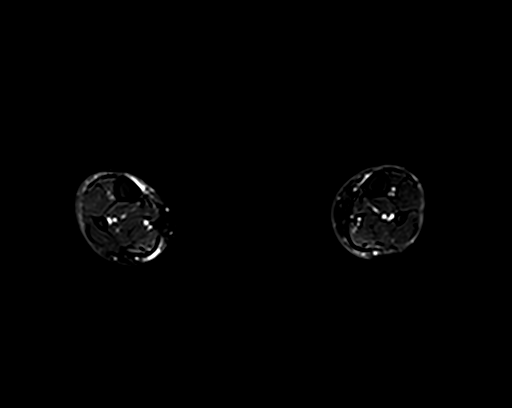
[im 21/26]
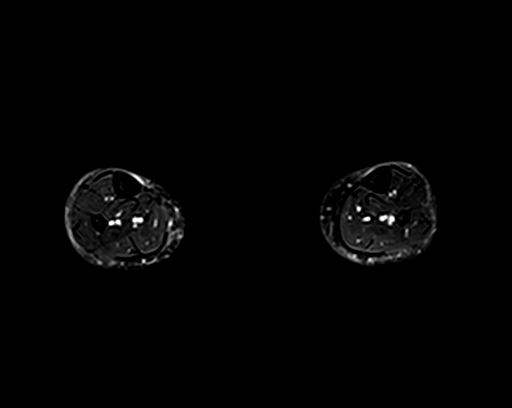
[im 26/26]
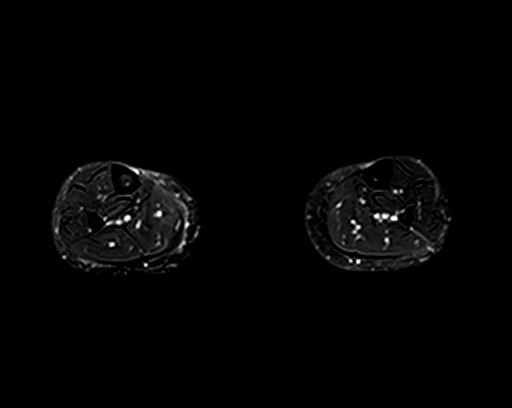

[Series 8: T1 · sagittal · 6.0mm · 0.96mm/px · 2 of 20 slices shown (3 of 3)]
[im 1/20]
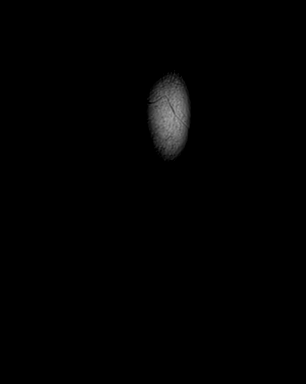
[im 7/20]
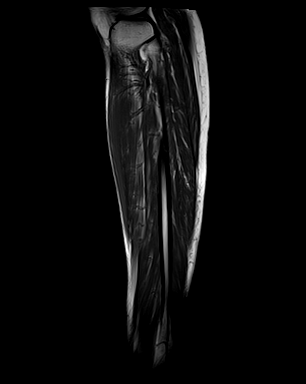

[Series 9: T2 fat-sat · sagittal · 6.0mm · 0.72mm/px · 4 of 20 slices shown (3 of 3)]
[im 1/20]
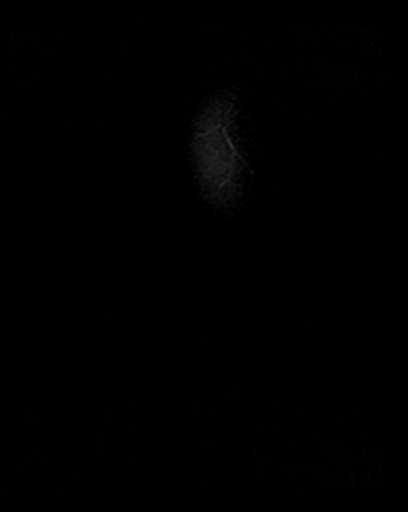
[im 7/20]
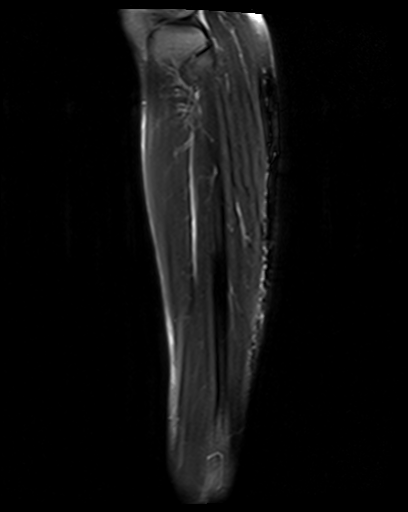
[im 13/20]
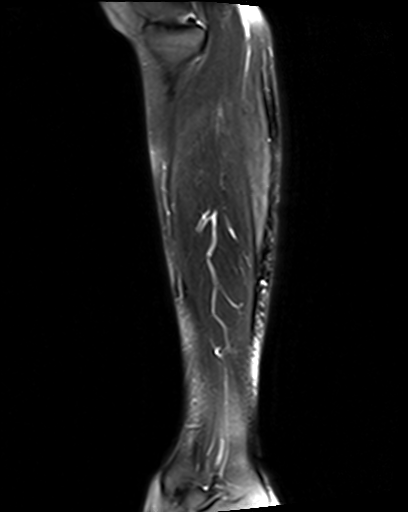
[im 20/20]
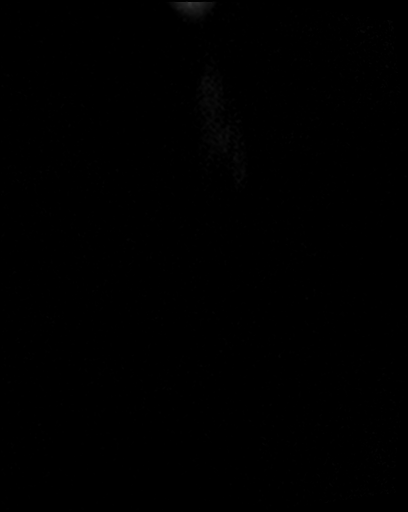

[32 of 40 positions shown; findings below may reference images not displayed]

FINDINGS: Bones/Joint/Cartilage

No marrow signal abnormality. No fracture or dislocation. Normal
alignment. No joint effusion.

Ligaments

Medial and lateral ankle ligaments are grossly intact.

Muscles and Tendons
Flexor, peroneal, extensor, plantaris, and Achilles tendons are
intact. There is a vertical myofascial tear of the medial
gastrocnemius muscle. No muscle atrophy.

Soft tissue
Small 1.3 x 0.5 x 4.0 cm fluid collection with intrinsic T1
hyperintensity between the medial gastrocnemius and soleus muscles,
consistent with hematoma. No soft tissue mass.
IMPRESSION: 1. Vertical myofascial tear of the right medial gastrocnemius muscle
with small 4.0 cm hematoma between the gastrocnemius and soleus
muscles.

## 2018-10-24 ENCOUNTER — Other Ambulatory Visit (HOSPITAL_COMMUNITY): Payer: Self-pay | Admitting: Internal Medicine

## 2018-10-24 ENCOUNTER — Ambulatory Visit
Admission: RE | Admit: 2018-10-24 | Discharge: 2018-10-24 | Disposition: A | Discharge status: Home / Self Care | Payer: BLUE CROSS/BLUE SHIELD | Source: Ambulatory Visit | Attending: Internal Medicine | Admitting: Internal Medicine

## 2018-10-24 ENCOUNTER — Other Ambulatory Visit: Payer: Self-pay | Admitting: Internal Medicine

## 2018-10-24 DIAGNOSIS — R079 Chest pain, unspecified: Secondary | ICD-10-CM | POA: Diagnosis not present

## 2018-10-24 DIAGNOSIS — E278 Other specified disorders of adrenal gland: Secondary | ICD-10-CM | POA: Insufficient documentation

## 2018-10-24 MED ORDER — IOPAMIDOL (ISOVUE-370) INJECTION 76%
75.0000 mL | Freq: Once | INTRAVENOUS | Status: AC | PRN
  Administered 2018-10-24: 75 mL via INTRAVENOUS

## 2019-01-07 ENCOUNTER — Other Ambulatory Visit: Payer: Self-pay | Admitting: Neurology

## 2019-01-07 DIAGNOSIS — G35 Multiple sclerosis: Secondary | ICD-10-CM

## 2019-01-08 DIAGNOSIS — R29898 Other symptoms and signs involving the musculoskeletal system: Secondary | ICD-10-CM | POA: Insufficient documentation

## 2019-01-08 DIAGNOSIS — G35 Multiple sclerosis: Secondary | ICD-10-CM | POA: Insufficient documentation

## 2019-01-08 DIAGNOSIS — M542 Cervicalgia: Secondary | ICD-10-CM | POA: Insufficient documentation

## 2019-01-14 ENCOUNTER — Ambulatory Visit: Payer: BLUE CROSS/BLUE SHIELD

## 2019-01-26 ENCOUNTER — Ambulatory Visit: Payer: BLUE CROSS/BLUE SHIELD

## 2019-02-12 ENCOUNTER — Other Ambulatory Visit: Payer: Self-pay | Admitting: Neurology

## 2019-02-12 DIAGNOSIS — M542 Cervicalgia: Secondary | ICD-10-CM

## 2019-04-08 ENCOUNTER — Other Ambulatory Visit: Payer: Self-pay

## 2019-04-08 ENCOUNTER — Ambulatory Visit
Admission: RE | Admit: 2019-04-08 | Discharge: 2019-04-08 | Disposition: A | Discharge status: Home / Self Care | Payer: BLUE CROSS/BLUE SHIELD | Source: Ambulatory Visit | Attending: Neurology | Admitting: Neurology

## 2019-04-08 DIAGNOSIS — M542 Cervicalgia: Secondary | ICD-10-CM | POA: Insufficient documentation

## 2019-04-14 DIAGNOSIS — H538 Other visual disturbances: Secondary | ICD-10-CM | POA: Insufficient documentation

## 2019-04-14 DIAGNOSIS — R200 Anesthesia of skin: Secondary | ICD-10-CM | POA: Insufficient documentation

## 2019-04-14 DIAGNOSIS — R202 Paresthesia of skin: Secondary | ICD-10-CM | POA: Insufficient documentation

## 2019-04-16 ENCOUNTER — Other Ambulatory Visit: Payer: Self-pay | Admitting: Neurology

## 2019-04-16 DIAGNOSIS — G35 Multiple sclerosis: Secondary | ICD-10-CM

## 2019-04-29 ENCOUNTER — Ambulatory Visit: Payer: BLUE CROSS/BLUE SHIELD

## 2019-05-01 ENCOUNTER — Ambulatory Visit
Admission: RE | Admit: 2019-05-01 | Discharge: 2019-05-01 | Disposition: A | Discharge status: Home / Self Care | Payer: BC Managed Care – PPO | Source: Ambulatory Visit | Attending: Neurology | Admitting: Neurology

## 2019-05-01 ENCOUNTER — Other Ambulatory Visit: Payer: Self-pay

## 2019-05-01 DIAGNOSIS — G35 Multiple sclerosis: Secondary | ICD-10-CM

## 2019-05-01 MED ORDER — GADOBUTROL 1 MMOL/ML IV SOLN
8.0000 mL | Freq: Once | INTRAVENOUS | Status: AC | PRN
  Administered 2019-05-01: 8 mL via INTRAVENOUS

## 2019-12-23 DIAGNOSIS — R7303 Prediabetes: Secondary | ICD-10-CM | POA: Insufficient documentation

## 2020-01-20 DIAGNOSIS — R200 Anesthesia of skin: Secondary | ICD-10-CM | POA: Insufficient documentation

## 2020-02-01 ENCOUNTER — Other Ambulatory Visit: Payer: Self-pay | Admitting: Family Medicine

## 2020-02-01 ENCOUNTER — Other Ambulatory Visit: Payer: Self-pay

## 2020-02-01 ENCOUNTER — Ambulatory Visit
Admission: RE | Admit: 2020-02-01 | Discharge: 2020-02-01 | Disposition: A | Discharge status: Home / Self Care | Payer: BC Managed Care – PPO | Source: Ambulatory Visit | Attending: Family Medicine | Admitting: Family Medicine

## 2020-02-01 DIAGNOSIS — R319 Hematuria, unspecified: Secondary | ICD-10-CM | POA: Diagnosis present

## 2020-02-04 ENCOUNTER — Encounter: Payer: Self-pay | Admitting: Urology

## 2020-02-04 ENCOUNTER — Other Ambulatory Visit: Payer: Self-pay

## 2020-02-04 ENCOUNTER — Ambulatory Visit (INDEPENDENT_AMBULATORY_CARE_PROVIDER_SITE_OTHER): Payer: BC Managed Care – PPO | Admitting: Urology

## 2020-02-04 VITALS — BP 132/80 | HR 90 | Ht 63.0 in | Wt 180.0 lb

## 2020-02-04 DIAGNOSIS — R319 Hematuria, unspecified: Secondary | ICD-10-CM | POA: Diagnosis not present

## 2020-02-04 DIAGNOSIS — N201 Calculus of ureter: Secondary | ICD-10-CM

## 2020-02-04 LAB — URINALYSIS, COMPLETE
Bilirubin, UA: NEGATIVE
Glucose, UA: NEGATIVE
Ketones, UA: NEGATIVE
Leukocytes,UA: NEGATIVE
Nitrite, UA: NEGATIVE
Protein,UA: NEGATIVE
Specific Gravity, UA: 1.01 (ref 1.005–1.030)
Urobilinogen, Ur: 0.2 mg/dL (ref 0.2–1.0)
pH, UA: 6 (ref 5.0–7.5)

## 2020-02-04 LAB — MICROSCOPIC EXAMINATION

## 2020-02-04 NOTE — Patient Instructions (Signed)

## 2020-02-04 NOTE — Progress Notes (Signed)
02/04/20 12:24 PM   Katie Little 18-Aug-1976 [id number]  CC: Left groin pain, urgency/frequency  HPI: I saw Katie Little in urology clinic today for evaluation of left groin pain secondary to a left distal ureteral stone.  She is a 44 year old female who reports she started having left flank and groin pain on 3/23.  CT on 3/29 showed a 4 mm left distal ureteral stone with some upstream mild hydroureteronephrosis.  There were no other renal stones.  Urinalysis on 3/25 and 3/29 were benign aside from microscopic hematuria.  She denies any fevers or chills.  She reports she has had some worsening of her urgency and frequency today.  Urinalysis today is benign with 0-5 WBCs, 0 RBCs, few bacteria, nitrite negative.  She denies any prior stone episodes.   PMH: Past Medical History:  Diagnosis Date  . Abdominal pain    s/p EGD per GI 2011  . Anemia   . Anxiety    H/O episodic Alprazolam use  . Cholelithiasis 2011   S/P laproscopic cholecystectomy with normal Intra-Op cholangiogram  . Depression    Intolerant of Effexor, Lexapro in the past with SE's; states does not want to take again  . Eczema   . Fatigue    with h/o normal overnight pulse oximetry  . Hypothyroidism    H/O Graves Disease; took meds and then became hypothyroid  . Nephrocalcinosis    with h/o normal PTH/Cr/urine calcium    Surgical History: Past Surgical History:  Procedure Laterality Date  . CESAREAN SECTION    . CHOLECYSTECTOMY  12/2009  . ESOPHAGOGASTRODUODENOSCOPY  05/25/2010   and End[facility] [place] - normal esoph; mild gastritis; normal CBD; GB absent; normal; no biliary stones; bx to check for H. Pylori and celiac sprue  . VAGINAL HYSTERECTOMY  2013    Family History: Family History  Problem Relation Age of Onset  . Heart disease Mother   . Hyperlipidemia Mother   . Hypertension Mother   . Kidney disease Mother        stones  . Migraines Mother   . Diabetes Father   . Cancer Neg Hx        No colon, breast or  ovarian CA  . Kidney cancer Neg Hx   . Prostate cancer Neg Hx   . Bladder Cancer Neg Hx     Social History:  reports that she has been smoking cigarettes. She has been smoking about 1.00 pack per day. She has never used smokeless tobacco. She reports current alcohol use. She reports current drug use. Drug: Hydrocodone.  Physical Exam: BP 132/80   Pulse 90   Ht 5\' 3"  (1.6 m)   Wt 180 lb (81.6 kg)   LMP 07/05/2012   BMI 31.89 kg/m    Constitutional: Appears uncomfortable Cardiovascular: Regular rate and rhythm Respiratory: Clear to auscultation bilaterally GI: Abdomen is soft, nontender, nondistended, no abdominal masses  Laboratory Data: Reviewed, see HPI  Pertinent Imaging: I have personally reviewed the CT dated 3/29, 4 mm left distal ureteral stone with mild upstream hydronephrosis.  Assessment & Plan:   In summary, she is a 44 year old female with a 4 mm left distal ureteral stone over the last week with no clinical or laboratory signs of infection.  Her pain is well controlled.  We discussed various treatment options for urolithiasis including observation with or without medical expulsive therapy, shockwave lithotripsy (SWL), or ureteroscopy and laser lithotripsy with stent placement.  We discussed that management is based on stone  size, location, density, patient co-morbidities, and patient preference.   Stones <37mm in size have a >80% spontaneous passage rate. Data surrounding the use of tamsulosin for medical expulsive therapy is controversial, but meta analyses suggests it is most efficacious for distal stones between 5-35mm in size. Possible side effects include dizziness/lightheadedness, and retrograde ejaculation.  SWL has a lower stone free rate in a single procedure, but also a lower complication rate compared to ureteroscopy and avoids a stent and associated stent related symptoms. Possible complications include renal hematoma, steinstrasse, and need for additional  treatment.  Ureteroscopy with laser lithotripsy and stent placement has a higher stone free rate than SWL in a single procedure, however increased complication rate including possible infection, ureteral injury, bleeding, and stent related morbidity. Common stent related symptoms include dysuria, urgency/frequency, and flank pain.  After an extensive discussion of the risks and benefits of the above treatment options, the patient would like to proceed with medical expulsive therapy.  -Continue Flomax, pain meds as needed -RTC early next week with KUB to evaluate for stone passage, consider as well or ureteroscopy if persistent stone and pain   Nickolas Madrid, MD 02/04/2020  [facility] [address] [place], [place] [address] [phone]

## 2020-02-10 ENCOUNTER — Ambulatory Visit: Payer: Self-pay | Admitting: Physician Assistant

## 2020-02-22 ENCOUNTER — Other Ambulatory Visit: Payer: Self-pay | Admitting: Urology

## 2020-02-24 ENCOUNTER — Telehealth: Payer: Self-pay

## 2020-02-24 NOTE — Progress Notes (Signed)
See telephone encounter.

## 2020-02-24 NOTE — Telephone Encounter (Signed)
-----   Message from Sondra Come, MD sent at 02/24/2020 10:27 AM EDT -----   ----- Message ----- From: Kipp Brood Sent: 02/22/2020   2:30 PM EDT To: Sondra Come, MD

## 2020-02-24 NOTE — Progress Notes (Signed)
Stone was calcium oxalate, most common type of kidney stone.  General stone prevention strategies including adequate hydration with goal of producing 2.5 L of urine daily, increasing citric acid intake, increasing calcium intake during high oxalate meals, minimizing animal protein, and decreasing salt intake.  Legrand Rams, MD 02/24/2020

## 2020-04-25 ENCOUNTER — Emergency Department: Payer: BC Managed Care – PPO

## 2020-04-25 ENCOUNTER — Other Ambulatory Visit: Payer: Self-pay

## 2020-04-25 ENCOUNTER — Emergency Department
Admission: EM | Admit: 2020-04-25 | Discharge: 2020-04-25 | Disposition: A | Discharge status: Home / Self Care | Payer: BC Managed Care – PPO | Attending: Emergency Medicine | Admitting: Emergency Medicine

## 2020-04-25 ENCOUNTER — Encounter: Payer: Self-pay | Admitting: Radiology

## 2020-04-25 DIAGNOSIS — R079 Chest pain, unspecified: Secondary | ICD-10-CM | POA: Diagnosis present

## 2020-04-25 DIAGNOSIS — Z5321 Procedure and treatment not carried out due to patient leaving prior to being seen by health care provider: Secondary | ICD-10-CM | POA: Insufficient documentation

## 2020-04-25 LAB — BASIC METABOLIC PANEL
Anion gap: 6 (ref 5–15)
BUN: 17 mg/dL (ref 6–20)
CO2: 28 mmol/L (ref 22–32)
Calcium: 8.9 mg/dL (ref 8.9–10.3)
Chloride: 106 mmol/L (ref 98–111)
Creatinine, Ser: 0.87 mg/dL (ref 0.44–1.00)
GFR calc Af Amer: >60 mL/min (ref >60)
GFR calc non Af Amer: >60 mL/min (ref >60)
Glucose, Bld: 104 mg/dL — ABNORMAL HIGH (ref 70–99)
Potassium: 3.6 mmol/L (ref 3.5–5.1)
Sodium: 140 mmol/L (ref 135–145)

## 2020-04-25 LAB — TROPONIN I (HIGH SENSITIVITY)
Troponin I (High Sensitivity): 6 ng/L (ref <18)
Troponin I (High Sensitivity): 4 ng/L (ref <18)

## 2020-04-25 LAB — CBC
HCT: 36.2 % (ref 36.0–46.0)
Hemoglobin: 12.4 g/dL (ref 12.0–15.0)
MCH: 30.7 pg (ref 26.0–34.0)
MCHC: 34.3 g/dL (ref 30.0–36.0)
MCV: 89.6 fL (ref 80.0–100.0)
Platelets: 247 10*3/uL (ref 150–400)
RBC: 4.04 MIL/uL (ref 3.87–5.11)
RDW: 12.9 % (ref 11.5–15.5)
WBC: 11.1 10*3/uL — ABNORMAL HIGH (ref 4.0–10.5)
nRBC: 0 % (ref 0.0–0.2)

## 2020-04-25 NOTE — ED Notes (Signed)
Pt states she is leaving. Pt encouraged to return with any concern and follow up with pmd. Pt verbalizes understanding.

## 2020-04-25 NOTE — ED Triage Notes (Signed)
Patient reports chest pain off/on for couple hours, described as pressure with radiation up into jaw.  Patient reports previous episode and was worked up and everything was ok.

## 2020-06-09 DIAGNOSIS — R531 Weakness: Secondary | ICD-10-CM | POA: Insufficient documentation

## 2020-07-07 ENCOUNTER — Emergency Department (HOSPITAL_COMMUNITY)
Admission: EM | Admit: 2020-07-07 | Discharge: 2020-07-08 | Disposition: A | Discharge status: Home / Self Care | Payer: BC Managed Care – PPO | Attending: Emergency Medicine | Admitting: Emergency Medicine

## 2020-07-07 ENCOUNTER — Encounter (HOSPITAL_COMMUNITY): Payer: Self-pay

## 2020-07-07 ENCOUNTER — Emergency Department (HOSPITAL_COMMUNITY): Payer: BC Managed Care – PPO

## 2020-07-07 DIAGNOSIS — G459 Transient cerebral ischemic attack, unspecified: Secondary | ICD-10-CM | POA: Diagnosis not present

## 2020-07-07 DIAGNOSIS — Z5321 Procedure and treatment not carried out due to patient leaving prior to being seen by health care provider: Secondary | ICD-10-CM | POA: Diagnosis not present

## 2020-07-07 LAB — COMPREHENSIVE METABOLIC PANEL
ALT: 18 U/L (ref 0–44)
AST: 17 U/L (ref 15–41)
Albumin: 3.7 g/dL (ref 3.5–5.0)
Alkaline Phosphatase: 56 U/L (ref 38–126)
Anion gap: 11 (ref 5–15)
BUN: 8 mg/dL (ref 6–20)
CO2: 25 mmol/L (ref 22–32)
Calcium: 9.1 mg/dL (ref 8.9–10.3)
Chloride: 103 mmol/L (ref 98–111)
Creatinine, Ser: 1.1 mg/dL — ABNORMAL HIGH (ref 0.44–1.00)
GFR calc Af Amer: >60 mL/min (ref >60)
GFR calc non Af Amer: >60 mL/min (ref >60)
Glucose, Bld: 100 mg/dL — ABNORMAL HIGH (ref 70–99)
Potassium: 3.4 mmol/L — ABNORMAL LOW (ref 3.5–5.1)
Sodium: 139 mmol/L (ref 135–145)
Total Bilirubin: 0.5 mg/dL (ref 0.3–1.2)
Total Protein: 6.7 g/dL (ref 6.5–8.1)

## 2020-07-07 LAB — CBC
HCT: 39.2 % (ref 36.0–46.0)
Hemoglobin: 12.7 g/dL (ref 12.0–15.0)
MCH: 29.8 pg (ref 26.0–34.0)
MCHC: 32.4 g/dL (ref 30.0–36.0)
MCV: 92 fL (ref 80.0–100.0)
Platelets: 309 10*3/uL (ref 150–400)
RBC: 4.26 MIL/uL (ref 3.87–5.11)
RDW: 12.6 % (ref 11.5–15.5)
WBC: 10.7 10*3/uL — ABNORMAL HIGH (ref 4.0–10.5)
nRBC: 0 % (ref 0.0–0.2)

## 2020-07-07 LAB — I-STAT CHEM 8, ED
BUN: 9 mg/dL (ref 6–20)
Calcium, Ion: 1.21 mmol/L (ref 1.15–1.40)
Chloride: 103 mmol/L (ref 98–111)
Creatinine, Ser: 1.1 mg/dL — ABNORMAL HIGH (ref 0.44–1.00)
Glucose, Bld: 90 mg/dL (ref 70–99)
HCT: 36 % (ref 36.0–46.0)
Hemoglobin: 12.2 g/dL (ref 12.0–15.0)
Potassium: 3.3 mmol/L — ABNORMAL LOW (ref 3.5–5.1)
Sodium: 143 mmol/L (ref 135–145)
TCO2: 26 mmol/L (ref 22–32)

## 2020-07-07 LAB — I-STAT BETA HCG BLOOD, ED (MC, WL, AP ONLY): I-stat hCG, quantitative: <5 m[IU]/mL (ref <5)

## 2020-07-07 LAB — DIFFERENTIAL
Abs Immature Granulocytes: 0.04 10*3/uL (ref 0.00–0.07)
Basophils Absolute: 0.1 10*3/uL (ref 0.0–0.1)
Basophils Relative: 1 %
Eosinophils Absolute: 0.3 10*3/uL (ref 0.0–0.5)
Eosinophils Relative: 3 %
Immature Granulocytes: 0 %
Lymphocytes Relative: 27 %
Lymphs Abs: 2.9 10*3/uL (ref 0.7–4.0)
Monocytes Absolute: 0.6 10*3/uL (ref 0.1–1.0)
Monocytes Relative: 6 %
Neutro Abs: 6.8 10*3/uL (ref 1.7–7.7)
Neutrophils Relative %: 63 %

## 2020-07-07 LAB — APTT: aPTT: 29 seconds (ref 24–36)

## 2020-07-07 LAB — PROTIME-INR
INR: 1 (ref 0.8–1.2)
Prothrombin Time: 12.6 seconds (ref 11.4–15.2)

## 2020-07-07 MED ORDER — SODIUM CHLORIDE 0.9% FLUSH
3.0000 mL | Freq: Once | INTRAVENOUS | Status: DC
Start: 2020-07-07 — End: 2020-07-08

## 2020-07-07 NOTE — ED Triage Notes (Signed)
Pt reports that for the past 2 days she has been having pain and numbness in her L arm, and dysarthria trouble getting her words out. Reports hx of DVT. Neuro intact bilaterally

## 2020-07-08 NOTE — ED Notes (Signed)
Pt called x 3 with no answer

## 2020-08-02 ENCOUNTER — Encounter: Payer: Self-pay | Admitting: Dietician

## 2020-08-02 ENCOUNTER — Other Ambulatory Visit: Payer: Self-pay

## 2020-08-02 ENCOUNTER — Encounter: Payer: BC Managed Care – PPO | Attending: Internal Medicine | Admitting: Dietician

## 2020-08-02 VITALS — Ht 63.0 in | Wt 168.6 lb

## 2020-08-02 DIAGNOSIS — R7303 Prediabetes: Secondary | ICD-10-CM

## 2020-08-02 NOTE — Patient Instructions (Signed)
   Keep up the great work!   Try incorporating frozen vegetables into lunch and dinner meals   Try reducing sugar in coffee to 1 tbsp

## 2020-08-02 NOTE — Progress Notes (Signed)
Medical Nutrition Therapy: Visit start time: 1045  end time: 1200  Assessment:  Diagnosis: prediabetes Past medical history: cholecystectomy, hypothyroid  Psychosocial issues/ stress concerns: pt rates stress level as "moderate" and feels "ok" about stress management    Preferred learning method:  . Auditory . Visual . Hands-on  Current weight: 168.6 lbs  Height: 5'3" Medications, supplements: reconciled in medical record   Progress and evaluation:   Pt reports quitting smoking about two months ago which led to several dietary changes   Cut out soda and sweet tea from diet, used to drink multiple cups of coffee while at work now has 1 12-oz cup per shift   A1c 6.1 in February 2021, 5.8 in August 2021  Pt currently on medical leave from work, pt reports chronic pain in arms described as tingling, burning, and shooting pain   Pt estimates she gets 3-5 hours of sleep per night  Attributes low sleep to chronic pain in 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     Physical activity: ADLs  Dietary Intake:  Usual eating pattern includes 2-3 meals and 1-3 snacks per day. Dining out frequency: 1-2 meals per week.  Typical work day  Breakfast (afternoon/evening): 12 oz coffee with 2-4 oz cream and 3 tbsp sugar; chicken breast and salad (super greens, skinny girl dressing) Snack: apple, banana, mandarin oranges Lunch: 1/2 [place] and cheese sandwich on whole wheat  Snack: same as above  Supper (morning): chicken with salad Beverages: coffee, water  Nutrition Care Education: Basic nutrition: basic food groups, appropriate nutrient balance,  appropriate meal and snack schedule, general nutrition guidelines    Weight control: importance of low sugar and low fat choices, portion control strategies Advanced nutrition: cooking techniques, food label reading Prediabetes:  goals for BGs, appropriate meal and snack schedule, appropriate carb intake and balance, healthy carb choices, role of fiber, protein, fat  Nutritional Diagnosis:  Nenzel-2.2 Altered nutrition-related laboratory As related to prediabetes.  As evidenced by A1c of 5.8 on 06/2020.  Intervention:  Discussion and instruction as noted above.  Pt reports living with chronic pain in arms and hands.  Reviewed inflammation and swelling may be worsened by excess sugar and sodium intake.  Reviewed link between sleep patterns and snacking habits. Established goals for additional change.    Increase vegetable intake   Incorporate vegetables at lunch, dinner, and snacks    Try frozen veggies that come in microwaveable pouches (may be tender than fresh and low prep time)  Smoothies may be easier to consume when on the go   Decrease sugar-sweetened beverage consumption   Switch from sweet to unsweet tea with sugar substitute   Switch from regular to diet soda  Drink at least 64oz water daily (add crystal light, lemon, or mio as needed)    Stress management   Increase quality of sleep, at follow up discuss link between sleep and weight management   At follow up, discuss Mindful Eating handout    Increase physical activity   Incrementally reintroduce exercise back into routine   150 minutes per week is recommendation   Try chair yoga or seated  exercises on YouTube  Maintain fluid intake   Drink at least 64oz water daily   Decrease sodium intake   Reduce sodium intake to recommended 1500mg /day   Read nutrition labels on packaged foods to monitor sodium intake   400-600 mg/meal  <200 mg/snack   Avoid processed meats    Education Materials given:  . General  diet guidelines for Diabetes . Food record handout  . Plate Planner with food lists  . Goals/ instructions  Learner/ who was taught:  . Patient   Level of understanding: Verbalizes/ demonstrates competency  Demonstrated degree of understanding via:   Teach back Learning barriers: . None  Willingness to learn/ readiness for change: . Eager, change in progress  Monitoring and Evaluation:  Dietary intake, exercise, BGs and A1c, and body weight      follow up: Novemer 10 at 11am

## 2020-08-09 ENCOUNTER — Other Ambulatory Visit: Payer: Self-pay | Admitting: Orthopedic Surgery

## 2020-08-09 DIAGNOSIS — R202 Paresthesia of skin: Secondary | ICD-10-CM

## 2020-08-11 ENCOUNTER — Ambulatory Visit
Admission: RE | Admit: 2020-08-11 | Discharge: 2020-08-11 | Disposition: A | Discharge status: Home / Self Care | Payer: BC Managed Care – PPO | Source: Ambulatory Visit | Attending: Orthopedic Surgery | Admitting: Orthopedic Surgery

## 2020-08-11 ENCOUNTER — Other Ambulatory Visit: Payer: Self-pay

## 2020-08-11 DIAGNOSIS — R202 Paresthesia of skin: Secondary | ICD-10-CM | POA: Insufficient documentation

## 2020-08-11 DIAGNOSIS — R200 Anesthesia of skin: Secondary | ICD-10-CM | POA: Diagnosis not present

## 2020-09-14 ENCOUNTER — Ambulatory Visit: Payer: BC Managed Care – PPO | Admitting: Dietician

## 2022-04-27 ENCOUNTER — Encounter (HOSPITAL_COMMUNITY): Payer: Self-pay

## 2022-04-27 ENCOUNTER — Other Ambulatory Visit: Payer: Self-pay

## 2022-04-27 ENCOUNTER — Emergency Department (HOSPITAL_COMMUNITY): Payer: BC Managed Care – PPO

## 2022-04-27 ENCOUNTER — Emergency Department (HOSPITAL_COMMUNITY)
Admission: EM | Admit: 2022-04-27 | Discharge: 2022-04-27 | Disposition: A | Discharge status: Home / Self Care | Payer: BC Managed Care – PPO | Attending: Emergency Medicine | Admitting: Emergency Medicine

## 2022-04-27 DIAGNOSIS — R519 Headache, unspecified: Secondary | ICD-10-CM | POA: Insufficient documentation

## 2022-04-27 DIAGNOSIS — M5030 Other cervical disc degeneration, unspecified cervical region: Secondary | ICD-10-CM | POA: Insufficient documentation

## 2022-04-27 DIAGNOSIS — S161XXA Strain of muscle, fascia and tendon at neck level, initial encounter: Secondary | ICD-10-CM | POA: Diagnosis not present

## 2022-04-27 DIAGNOSIS — Y92410 Unspecified street and highway as the place of occurrence of the external cause: Secondary | ICD-10-CM | POA: Diagnosis not present

## 2022-04-27 DIAGNOSIS — S199XXA Unspecified injury of neck, initial encounter: Secondary | ICD-10-CM | POA: Diagnosis present

## 2022-04-27 MED ORDER — METHOCARBAMOL 750 MG PO TABS: 750.0000 mg | ORAL_TABLET | Freq: Three times a day (TID) | ORAL | 0 refills | Status: DC | PRN

## 2022-04-27 MED ORDER — ACETAMINOPHEN 500 MG PO TABS
1000.0000 mg | ORAL_TABLET | Freq: Once | ORAL | Status: AC
  Administered 2022-04-27: 1000 mg via ORAL
  Filled 2022-04-27: qty 2

## 2022-06-04 ENCOUNTER — Other Ambulatory Visit: Payer: Self-pay | Admitting: Orthopedic Surgery

## 2022-06-04 DIAGNOSIS — S239XXD Sprain of unspecified parts of thorax, subsequent encounter: Secondary | ICD-10-CM

## 2022-06-04 DIAGNOSIS — S134XXA Sprain of ligaments of cervical spine, initial encounter: Secondary | ICD-10-CM

## 2022-06-04 DIAGNOSIS — S39012D Strain of muscle, fascia and tendon of lower back, subsequent encounter: Secondary | ICD-10-CM

## 2022-06-27 ENCOUNTER — Other Ambulatory Visit: Payer: Self-pay | Admitting: Orthopedic Surgery

## 2022-06-27 DIAGNOSIS — S39012D Strain of muscle, fascia and tendon of lower back, subsequent encounter: Secondary | ICD-10-CM

## 2022-06-27 DIAGNOSIS — S239XXD Sprain of unspecified parts of thorax, subsequent encounter: Secondary | ICD-10-CM

## 2022-06-27 DIAGNOSIS — S134XXA Sprain of ligaments of cervical spine, initial encounter: Secondary | ICD-10-CM

## 2022-07-16 ENCOUNTER — Ambulatory Visit
Admission: RE | Admit: 2022-07-16 | Discharge: 2022-07-16 | Disposition: A | Discharge status: Home / Self Care | Payer: 59 | Source: Ambulatory Visit | Attending: Orthopedic Surgery | Admitting: Orthopedic Surgery

## 2022-07-16 DIAGNOSIS — S39012D Strain of muscle, fascia and tendon of lower back, subsequent encounter: Secondary | ICD-10-CM

## 2022-07-16 DIAGNOSIS — S134XXA Sprain of ligaments of cervical spine, initial encounter: Secondary | ICD-10-CM

## 2022-07-16 DIAGNOSIS — S239XXD Sprain of unspecified parts of thorax, subsequent encounter: Secondary | ICD-10-CM

## 2022-09-26 ENCOUNTER — Other Ambulatory Visit: Payer: Self-pay | Admitting: Internal Medicine

## 2022-09-26 ENCOUNTER — Ambulatory Visit
Admission: RE | Admit: 2022-09-26 | Discharge: 2022-09-26 | Disposition: A | Discharge status: Home / Self Care | Payer: 59 | Source: Ambulatory Visit | Attending: Internal Medicine | Admitting: Internal Medicine

## 2022-09-26 DIAGNOSIS — R600 Localized edema: Secondary | ICD-10-CM | POA: Diagnosis present

## 2023-07-29 DIAGNOSIS — E785 Hyperlipidemia, unspecified: Secondary | ICD-10-CM | POA: Insufficient documentation

## 2023-08-18 DIAGNOSIS — Z789 Other specified health status: Secondary | ICD-10-CM | POA: Insufficient documentation

## 2023-08-18 DIAGNOSIS — Z79899 Other long term (current) drug therapy: Secondary | ICD-10-CM | POA: Insufficient documentation

## 2023-08-18 DIAGNOSIS — G894 Chronic pain syndrome: Secondary | ICD-10-CM | POA: Insufficient documentation

## 2023-08-18 DIAGNOSIS — M899 Disorder of bone, unspecified: Secondary | ICD-10-CM | POA: Insufficient documentation

## 2023-08-18 NOTE — Patient Instructions (Incomplete)
______________________________________________________________________    New Patients  Welcome to [facility] at [facility].   Initial Visit The first or initial visit consists of an evaluation only.   Interventional pain management.  We offer therapies other than opioid controlled substances to manage chronic pain. These include, but are not limited to, diagnostic, therapeutic, and palliative specialized injection therapies (i.e.: Epidural Steroids, Facet Blocks, etc.). We specialize in a variety of nerve blocks as well as radiofrequency treatments. We offer pain implant evaluations and trials, as well as follow up management. In addition we also provide a variety joint injections, including Viscosupplementation (AKA: Gel Therapy).  Prescription Pain Medication. We specialize in alternatives to opioids. We can provide evaluations and recommendations for/of pharmacologic therapies based on CDC Guidelines.  We no longer take patients for long-term medication management. We will not be taking over your pain medications.  ______________________________________________________________________     ______________________________________________________________________    Patient Information update  To: All of our patients.  Re: Name change.  It has been made official that our current name, "[facility]"   will soon be changed to "[facility]".   The purpose of this change is to eliminate any confusion created by the concept of our practice being a "Medication Management Pain Clinic". In the past this has led to the misconception that we treat pain primarily by the use of prescription medications.  Nothing can be farther from the truth.   Understanding PAIN MANAGEMENT: To further understand what our practice does, you first have to  understand that "Pain Management" is a subspecialty that requires additional training once a physician has completed their specialty training, which can be in either Anesthesia, Neurology, Psychiatry, or Physical Medicine and Rehabilitation (PMR). Each one of these contributes to the final approach taken by each physician to the management of their patient's pain. To be a "Pain Management Specialist" you must have first completed one of the specialty trainings below.  Anesthesiologists - trained in clinical pharmacology and interventional techniques such as nerve blockade and regional as well as central neuroanatomy. They are trained to block pain before, during, and after surgical interventions.  Neurologists - trained in the diagnosis and pharmacological treatment of complex neurological conditions, such as Multiple Sclerosis, Parkinson's, spinal cord injuries, and other systemic conditions that may be associated with symptoms that may include but are not limited to pain. They tend to rely primarily on the treatment of chronic pain using prescription medications.  Psychiatrist - trained in conditions affecting the psychosocial wellbeing of patients including but not limited to depression, anxiety, schizophrenia, personality disorders, addiction, and other substance use disorders that may be associated with chronic pain. They tend to rely primarily on the treatment of chronic pain using prescription medications.   Physical Medicine and Rehabilitation (PMR) physicians, also known as physiatrists - trained to treat a wide variety of medical conditions affecting the brain, spinal cord, nerves, bones, joints, ligaments, muscles, and tendons. Their training is primarily aimed at treating patients that have suffered injuries that have caused severe physical impairment. Their training is primarily aimed at the physical therapy and rehabilitation of those patients. They may also work alongside orthopedic surgeons  or neurosurgeons using their expertise in assisting surgical patients to recover after their surgeries.  INTERVENTIONAL PAIN MANAGEMENT is sub-subspecialty of Pain Management.  Our physicians are Board-certified in Anesthesia, Pain Management, and Interventional Pain Management.  This meaning that not  only have they been trained and Board-certified in their specialty of Anesthesia, and subspecialty of Pain Management, but they have also received further training in the sub-subspecialty of Interventional Pain Management, in order to become Board-certified as INTERVENTIONAL PAIN MANAGEMENT SPECIALIST.    Mission: Our goal is to use our skills in  INTERVENTIONAL PAIN MANAGEMENT as alternatives to the chronic use of prescription opioid medications for the treatment of pain. To make this more clear, we have changed our name to reflect what we do and offer. We will continue to offer medication management assessment and recommendations, but we will not be taking over any patient's medication management.  ______________________________________________________________________       ______________________________________________________________________    Procedure instructions  Stop blood-thinners  Do not eat or drink fluids (other than water) for 6 hours before your procedure  No water for 2 hours before your procedure  Take your blood pressure medicine with a sip of water  Arrive 30 minutes before your appointment  If sedation is planned, bring suitable driver. Pennie Banter, Benedetto Goad, & public transportation are NOT APPROVED)  Carefully read the "Preparing for your procedure" detailed instructions  If you have questions call us at [phone]  ______________________________________________________________________      ______________________________________________________________________    Preparing for your procedure  Appointments: If you think you may not be able to keep your appointment,  call 24-48 hours in advance to cancel. We need time to make it available to others.  During your procedure appointment there will be: No Prescription Refills. No disability issues to discussed. No medication changes or discussions.  Instructions: Food intake: Avoid eating anything solid for at least 8 hours prior to your procedure. Clear liquid intake: You may take clear liquids such as water up to 2 hours prior to your procedure. (No carbonated drinks. No soda.) Transportation: Unless otherwise stated by your physician, bring a driver. (Driver cannot be a Market researcher, Pharmacist, community, or any other form of public transportation.) Morning Medicines: Except for blood thinners, take all of your other morning medications with a sip of water. Make sure to take your heart and blood pressure medicines. If your blood pressure's lower number is above 100, the case will be rescheduled. Blood thinners: Make sure to stop your blood thinners as instructed.  If you take a blood thinner, but were not instructed to stop it, call our office [phone] and ask to talk to a nurse. Not stopping a blood thinner prior to certain procedures could lead to serious complications. Diabetics on insulin: Notify the staff so that you can be scheduled 1st case in the morning. If your diabetes requires high dose insulin, take only  of your normal insulin dose the morning of the procedure and notify the staff that you have done so. Preventing infections: Shower with an antibacterial soap the morning of your procedure.  Build-up your immune system: Take 1000 mg of Vitamin C with every meal (3 times a day) the day prior to your procedure. Antibiotics: Inform the nursing staff if you are taking any antibiotics or if you have any conditions that may require antibiotics prior to procedures. (Example: recent joint implants)   Pregnancy: If you are pregnant make sure to notify the nursing staff. Not doing so may result in injury to the fetus,  including death.  Sickness: If you have a cold, fever, or any active infections, call and cancel or reschedule your procedure. Receiving steroids while having an infection may result in complications. Arrival: You must be  in the facility at least 30 minutes prior to your scheduled procedure. Tardiness: Your scheduled time is also the cutoff time. If you do not arrive at least 15 minutes prior to your procedure, you will be rescheduled.  Children: Do not bring any children with you. Make arrangements to keep them home. Dress appropriately: There is always a possibility that your clothing may get soiled. Avoid long dresses. Valuables: Do not bring any jewelry or valuables.  Reasons to call and reschedule or cancel your procedure: (Following these recommendations will minimize the risk of a serious complication.) Surgeries: Avoid having procedures within 2 weeks of any surgery. (Avoid for 2 weeks before or after any surgery). Flu Shots: Avoid having procedures within 2 weeks of a flu shots or . (Avoid for 2 weeks before or after immunizations). Barium: Avoid having a procedure within 7-10 days after having had a radiological study involving the use of radiological contrast. (Myelograms, Barium swallow or enema study). Heart attacks: Avoid any elective procedures or surgeries for the initial 6 months after a "Myocardial Infarction" (Heart Attack). Blood thinners: It is imperative that you stop these medications before procedures. Let us know if you if you take any blood thinner.  Infection: Avoid procedures during or within two weeks of an infection (including chest colds or gastrointestinal problems). Symptoms associated with infections include: Localized redness, fever, chills, night sweats or profuse sweating, burning sensation when voiding, cough, congestion, stuffiness, runny nose, sore throat, diarrhea, nausea, vomiting, cold or Flu symptoms, recent or current infections. It is specially important if  the infection is over the area that we intend to treat. Heart and lung problems: Symptoms that may suggest an active cardiopulmonary problem include: cough, chest pain, breathing difficulties or shortness of breath, dizziness, ankle swelling, uncontrolled high or unusually low blood pressure, and/or palpitations. If you are experiencing any of these symptoms, cancel your procedure and contact your primary care physician for an evaluation.  Remember:  Regular Business hours are:  Monday to Thursday 8:00 AM to 4:00 PM  Provider's Schedule: Delano Metz, MD:  Procedure days: Tuesday and Thursday 7:30 AM to 4:00 PM  Edward Jolly, MD:  Procedure days: Monday and Wednesday 7:30 AM to 4:00 PM Last  Updated: 06/25/2023 ______________________________________________________________________      ______________________________________________________________________    General Risks and Possible Complications  Patient Responsibilities: It is important that you read this as it is part of your informed consent. It is our duty to inform you of the risks and possible complications associated with treatments offered to you. It is your responsibility as a patient to read this and to ask questions about anything that is not clear or that you believe was not covered in this document.  Patient's Rights: You have the right to refuse treatment. You also have the right to change your mind, even after initially having agreed to have the treatment done. However, under this last option, if you wait until the last second to change your mind, you may be charged for the materials used up to that point.  Introduction: Medicine is not an Visual merchandiser. Everything in Medicine, including the lack of treatment(s), carries the potential for danger, harm, or loss (which is by definition: Risk). In Medicine, a complication is a secondary problem, condition, or disease that can aggravate an already existing one. All  treatments carry the risk of possible complications. The fact that a side effects or complications occurs, does not imply that the treatment was conducted incorrectly. It must be  clearly understood that these can happen even when everything is done following the highest safety standards.  No treatment: You can choose not to proceed with the proposed treatment alternative. The "PRO(s)" would include: avoiding the risk of complications associated with the therapy. The "CON(s)" would include: not getting any of the treatment benefits. These benefits fall under one of three categories: diagnostic; therapeutic; and/or palliative. Diagnostic benefits include: getting information which can ultimately lead to improvement of the disease or symptom(s). Therapeutic benefits are those associated with the successful treatment of the disease. Finally, palliative benefits are those related to the decrease of the primary symptoms, without necessarily curing the condition (example: decreasing the pain from a flare-up of a chronic condition, such as incurable terminal cancer).  General Risks and Complications: These are associated to most interventional treatments. They can occur alone, or in combination. They fall under one of the following six (6) categories: no benefit or worsening of symptoms; bleeding; infection; nerve damage; allergic reactions; and/or death. No benefits or worsening of symptoms: In Medicine there are no guarantees, only probabilities. No healthcare provider can ever guarantee that a medical treatment will work, they can only state the probability that it may. Furthermore, there is always the possibility that the condition may worsen, either directly, or indirectly, as a consequence of the treatment. Bleeding: This is more common if the patient is taking a blood thinner, either prescription or over the counter (example: Goody Powders, Fish oil, Aspirin, Garlic, etc.), or if suffering a condition  associated with impaired coagulation (example: Hemophilia, cirrhosis of the liver, low platelet counts, etc.). However, even if you do not have one on these, it can still happen. If you have any of these conditions, or take one of these drugs, make sure to notify your treating physician. Infection: This is more common in patients with a compromised immune system, either due to disease (example: diabetes, cancer, human immunodeficiency virus [HIV], etc.), or due to medications or treatments (example: therapies used to treat cancer and rheumatological diseases). However, even if you do not have one on these, it can still happen. If you have any of these conditions, or take one of these drugs, make sure to notify your treating physician. Nerve Damage: This is more common when the treatment is an invasive one, but it can also happen with the use of medications, such as those used in the treatment of cancer. The damage can occur to small secondary nerves, or to large primary ones, such as those in the spinal cord and brain. This damage may be temporary or permanent and it may lead to impairments that can range from temporary numbness to permanent paralysis and/or brain death. Allergic Reactions: Any time a substance or material comes in contact with our body, there is the possibility of an allergic reaction. These can range from a mild skin rash (contact dermatitis) to a severe systemic reaction (anaphylactic reaction), which can result in death. Death: In general, any medical intervention can result in death, most of the time due to an unforeseen complication. ______________________________________________________________________

## 2023-08-18 NOTE — Progress Notes (Unsigned)
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 biliary stones; bx to check for H. Pylori and celiac sprue   VAGINAL HYSTERECTOMY  2013   Active Ambulatory Problems    Diagnosis Date Noted   Herpes simplex virus (HSV) infection 04/20/2010   Thyroid disease 02/16/2010   NEPHROCALCINOSIS 03/14/2010   Anxiety 02/16/2010   Depression 02/16/2010   Calculus of gallbladder 02/16/2010   ECZEMA 02/16/2010   TENDINITIS, LEFT WRIST 04/20/2010   FATIGUE 07/06/2010   CHOLECYSTECTOMY, LAPAROSCOPIC, HX OF 02/16/2010   Abdominal pain 02/14/2011   Chest pain 03/01/2011   Tobacco abuse 03/01/2011   Menorrhagia 03/04/2012   Opiate dependence, continuous (HCC) 07/19/2012   Ocular migraine 04/03/2016   ADD (attention deficit disorder) 07/09/2012   Intrinsic  eczema 10/30/2017   Blurred vision 04/14/2019   Dysmenorrhea 07/09/2012   Fatigue 05/15/2016   History of cold sores 04/01/2015   History of narcotic addiction (HCC) 04/01/2015   Left adrenal mass (HCC) 10/24/2018   MS (multiple sclerosis) (HCC) 01/08/2019   Nausea 05/15/2016   Neck pain 01/08/2019   Tingling in extremities 04/14/2019   Numbness and tingling in both hands 01/20/2020   Numbness of arm 04/14/2019   Prediabetes 12/23/2019   Right arm weakness 01/08/2019   Vitamin D deficiency 10/30/2017   Hyperlipidemia 07/29/2023   Hypothyroidism due to Hashimoto's thyroiditis 08/08/2017   Obesity (BMI 30.0-34.9) 07/02/2018   Back pain 04/01/2015   Weakness 06/09/2020   Atopic dermatitis 12/20/2011   Chronic pain syndrome 08/18/2023   Pharmacologic therapy 08/18/2023   Disorder of skeletal system 08/18/2023   Problems influencing health status 08/18/2023   Resolved Ambulatory Problems    Diagnosis Date Noted   No Resolved Ambulatory Problems   Past Medical History:  Diagnosis Date   Abdominal pain    Anemia    Cholelithiasis 2011   Eczema    Hypothyroidism    Nephrocalcinosis    Constitutional Exam  General appearance: Well nourished, well developed, and well hydrated. In no apparent acute distress There were no vitals filed for this visit. BMI Assessment: Estimated body mass index is 29.88 kg/m as calculated from the following:   Height as of 04/27/22: 5\' 3"  (1.6 m).   Weight as of 04/27/22: 168 lb 10.4 oz (76.5 kg).  BMI interpretation table: BMI level Category Range association with higher incidence of chronic pain  <18 kg/m2 Underweight   18.5-24.9 kg/m2 Ideal body weight  25-29.9 kg/m2 Overweight Increased incidence by 20%  30-34.9 kg/m2 Obese (Class I) Increased incidence by 68%  35-39.9 kg/m2 Severe obesity (Class II) Increased incidence by 136%  >40 kg/m2 Extreme obesity (Class III) Increased incidence by 254%   Patient's current BMI Ideal Body weight   There is no height or weight on file to calculate BMI. Patient weight not recorded   BMI Readings from Last 4 Encounters:  04/27/22 29.88 kg/m  08/02/20 29.87 kg/m  07/07/20 28.17 kg/m  04/25/20 29.23 kg/m   Wt Readings from Last 4 Encounters:  04/27/22 168 lb 10.4 oz (76.5 kg)  08/02/20 168 lb 9.6 oz (76.5 kg)  07/07/20 159 lb (72.1 kg)  04/25/20 165 lb (74.8 kg)    Psych/Mental status: Alert, oriented x 3 (person, place, & time)       Eyes: PERLA Respiratory: No evidence of acute respiratory distress  Assessment  Primary Diagnosis & Pertinent Problem List: The primary encounter diagnosis was Chronic pain syndrome. Diagnoses of Pharmacologic therapy, Disorder of skeletal system, and Problems influencing health status were also pertinent to this visit.  Visit Diagnosis (New problems to examiner): 1. Chronic pain syndrome   2. Pharmacologic therapy   3. Disorder of skeletal system   4. Problems influencing health status    Plan of Care (Initial workup plan)  Note: Ms. Katie Little was reminded that as per protocol, today's visit has been an evaluation only. We have not taken over the patient's controlled substance management.  Problem-specific plan: No problem-specific Assessment & Plan notes found for this encounter.  Lab Orders  No laboratory test(s) ordered today   Imaging Orders  No imaging studies ordered today   Referral Orders  No referral(s) requested today   Procedure Orders    No procedure(s) ordered today   Pharmacotherapy (current): Medications ordered:  No orders of the defined types were placed in this encounter.  Medications administered during this visit: Katie Little had no medications administered during this visit.   Analgesic Pharmacotherapy:  Opioid Analgesics: For patients currently taking or requesting to take opioid analgesics, in accordance with [facility] Guidelines, we will assess their risks and indications for the  use of these substances. After completing our evaluation, we may offer recommendations, but we no longer take patients for medication management. The prescribing physician will ultimately decide, based on his/her training and level of comfort whether to adopt any of the recommendations, including whether or not to prescribe such medicines.  Membrane stabilizer: To be determined at a later time  Muscle relaxant: To be determined at a later time  NSAID: To be determined at a later time  Other analgesic(s): To be determined at a later time   Interventional management options: Ms. Katie Little was informed that there is no guarantee that she would be a candidate for interventional therapies. The decision will be based on the results of diagnostic studies, as well as Ms. Katie Little's risk profile.  Procedure(s) under consideration:  Pending results of ordered studies      Interventional Therapies  Risk Factors  Considerations  Medical Comorbidities:     Planned  Pending:      Under consideration:   Pending   Completed:   None at this time   Therapeutic  Palliative (PRN) options:   None established   Completed by other providers:   UE NCT: WNL (01/06/2020) Theora Master, MD)      Provider-requested follow-up: No follow-ups on file.  Future Appointments  Date Time Provider  Department Center  08/19/2023 10:00 AM Delano Metz, MD [facility] None    Duration of encounter: *** minutes.  Total time on encounter, as per AMA guidelines included both the face-to-face and non-face-to-face time personally spent by the physician and/or other qualified health care professional(s) on the day of the encounter (includes time in activities that require the physician or other qualified health care professional and does not include time in activities normally performed by clinical staff). Physician's time may include the following activities when performed: Preparing to see the patient (e.g.,  pre-charting review of records, searching for previously ordered imaging, lab work, and nerve conduction tests) Review of prior analgesic pharmacotherapies. Reviewing PMP Interpreting ordered tests (e.g., lab work, imaging, nerve conduction tests) Performing post-procedure evaluations, including interpretation of diagnostic procedures Obtaining and/or reviewing separately obtained history Performing a medically appropriate examination and/or evaluation Counseling and educating the patient/family/caregiver Ordering medications, tests, or procedures Referring and communicating with other health care professionals (when not separately reported) Documenting clinical information in the electronic or other health record Independently interpreting results (not separately reported) and communicating results to the patient/ family/caregiver Care coordination (not separately reported)  Note by: Oswaldo Done, MD (TTS technology used. I apologize for any typographical errors that were not detected and corrected.) Date: 08/19/2023; Time: 1:53 PM

## 2023-08-19 ENCOUNTER — Ambulatory Visit: Payer: Commercial Managed Care - PPO | Attending: Pain Medicine | Admitting: Pain Medicine

## 2023-08-19 ENCOUNTER — Encounter: Payer: Self-pay | Admitting: Pain Medicine

## 2023-08-19 VITALS — BP 134/68 | HR 93 | Temp 97.9°F | Ht 63.0 in | Wt 168.0 lb

## 2023-08-19 DIAGNOSIS — R519 Headache, unspecified: Secondary | ICD-10-CM | POA: Insufficient documentation

## 2023-08-19 DIAGNOSIS — M542 Cervicalgia: Secondary | ICD-10-CM | POA: Insufficient documentation

## 2023-08-19 DIAGNOSIS — M79652 Pain in left thigh: Secondary | ICD-10-CM | POA: Insufficient documentation

## 2023-08-19 DIAGNOSIS — Z79899 Other long term (current) drug therapy: Secondary | ICD-10-CM | POA: Diagnosis present

## 2023-08-19 DIAGNOSIS — M5010 Cervical disc disorder with radiculopathy, unspecified cervical region: Secondary | ICD-10-CM | POA: Insufficient documentation

## 2023-08-19 DIAGNOSIS — M79651 Pain in right thigh: Secondary | ICD-10-CM | POA: Diagnosis present

## 2023-08-19 DIAGNOSIS — G4486 Cervicogenic headache: Secondary | ICD-10-CM | POA: Diagnosis present

## 2023-08-19 DIAGNOSIS — Z789 Other specified health status: Secondary | ICD-10-CM | POA: Diagnosis present

## 2023-08-19 DIAGNOSIS — G8929 Other chronic pain: Secondary | ICD-10-CM | POA: Insufficient documentation

## 2023-08-19 DIAGNOSIS — M79601 Pain in right arm: Secondary | ICD-10-CM | POA: Diagnosis not present

## 2023-08-19 DIAGNOSIS — M79602 Pain in left arm: Secondary | ICD-10-CM | POA: Insufficient documentation

## 2023-08-19 DIAGNOSIS — M4722 Other spondylosis with radiculopathy, cervical region: Secondary | ICD-10-CM | POA: Insufficient documentation

## 2023-08-19 DIAGNOSIS — M899 Disorder of bone, unspecified: Secondary | ICD-10-CM | POA: Insufficient documentation

## 2023-08-19 DIAGNOSIS — G894 Chronic pain syndrome: Secondary | ICD-10-CM | POA: Insufficient documentation

## 2023-08-19 DIAGNOSIS — R937 Abnormal findings on diagnostic imaging of other parts of musculoskeletal system: Secondary | ICD-10-CM | POA: Insufficient documentation

## 2023-08-19 DIAGNOSIS — M5481 Occipital neuralgia: Secondary | ICD-10-CM | POA: Insufficient documentation

## 2023-08-19 NOTE — Progress Notes (Signed)
Safety precautions to be maintained throughout the outpatient stay will include: orient to surroundings, keep bed in low position, maintain call bell within reach at all times, provide assistance with transfer out of bed and ambulation.
